# Patient Record
Sex: Female | Born: 1945 | Race: White | Hispanic: No | Marital: Married
Health system: Midwestern US, Academic
[De-identification: ages and names within clinical notes are randomized; demographics above are authoritative.]

---

## 2017-04-05 NOTE — Unmapped (Signed)
Called and spoke with the patient and obtained a pre-clinic phone screen and reviewed the details of where to come for her appointment tomorrow. Patient verbalized understanding.

## 2017-04-06 ENCOUNTER — Ambulatory Visit: Admit: 2017-04-06 | Discharge: 2017-04-06 | Payer: Medicare (Managed Care)

## 2017-04-06 DIAGNOSIS — C541 Malignant neoplasm of endometrium: Secondary | ICD-10-CM

## 2017-04-06 NOTE — Unmapped (Signed)
History of Present Illness  HPI  Amy Knight is a 70 y.o. female with   Chief Complaint   Patient presents with   ??? New Patient Visit/ Consultation     Ref Dr Renshaw Endometrial Adenocarcinoma     GYNECOLOGY ONCOLOGY VISIT     History of Present Illness  Amy Knight is a 70 year old woman referred by Dr. Amy Renshaw for a new diagnosis of grade 1 endometrial adenocarcinoma.    Amy. Wetsel presented for an annual visit, and had an abnormal pap that showed ASC-H.  She notes that she did have an episode of vaginal bleeding after lifting a heavy object. She underwent a TVUS on 02/21/17 that revealed the following: The uterus is anteverted and normal in size measuring 7.4 x 4.8 x 3.9 cm. No focal uterine mass is present. The endometrium is thickened at 1.21 cm which is abnormal for a postmenopausal woman. Neither the right nor left ovary could be visualized. There is no free fluid. She underwent an office EMB on 02/24/17 that has benign endocervical cells but no endometrium.  She had a negative colposcopy with biopsies and ECC (benign X2) and endometrial biopsy on 03/24/17, with EMB pathology revealing a grade 1 endometrial adenocarcinoma.  After discussion regarding these findings, she was referred for evaluation and management.    Amy Knight is doing well. She is very nervous.   She denies any abdominal/pelvic pain.   She denies any bloating or fullness.  She reports normal bowel/bladder habits.  She denies any GU concerns. Denies any dysuria, frequency, hematuria, flank pain or fevers.  She denies any vaginal bleeding or discharge.   She denies neuropathy.   She reports no leg swelling.   Her appetite is good, and reports no nausea or vomiting.     Patient Active Problem List    Diagnosis   ??? Endometrial cancer determined by uterine biopsy (CMS Dx)     Overview Note:     (last update: 04/06/2017)     Endometrial cancer, FIGO, grade 1  History of left breast cancer in 2008: treated with lumpectomy and  radiation per Dr. Cobb in [place]    RMD: Dr. Amy Renshaw    2018:    Pap: ASC-H    02/21/2017:   TVUS: The uterus is anteverted and normal in size measuring 7.4 x 4.8 x 3.9 cm. No focal uterine mass is present. The endometrium is thickened at 1.21 cm which is abnormal for a postmenopausal woman. Neither the right nor left ovary could be visualized. There is no free fluid.    02/24/2017:   EMB: SMALL FRAGMENTS OF BENIGN ENDOCERVICAL GLANDULAR EPITHELIUM AND SQUAMOUs??EPITHELIUM. NO ENDOMETRIAL TISSUE IS PRESENT FOR EVALUATION.    03/24/2017:   Colposcopy, EMB, D & C     Cervical bx, 9 o'clock: negative for dysplasia. ECC: Benign endocervical glandular epithelum     Office hysteroscopy: OSH endometrioid grade 1 EC     Disposition: R TLH BSO SLND   Current disease status:   Genetics:  Survivorship plan: pending completion of treatment            Past Medical History  She  has a past medical history of Endometrial cancer determined by uterine biopsy (CMS Dx) (03/2017); History of left breast cancer (2008); and Hypercholesteremia.  Past Medical History:   Diagnosis Date   ??? Endometrial cancer determined by uterine biopsy (CMS Dx) 03/2017   ??? History of left breast cancer 2008      Treated with lumpectomy and radiation only per Dr. Cobb in [place]   ??? Hypercholesteremia        Past Surgical History  Past Surgical History:   Procedure Laterality Date   ??? APPENDECTOMY  1983    Abdominal incision   ??? BREAST LUMPECTOMY  2008    Left breast   ??? CERVICAL BIOPSY  03/2017   ??? DILATION AND CURETTAGE OF UTERUS  03/2017   ??? ENDOMETRIAL BIOPSY  03/2017   ??? TUBAL LIGATION  1978       Family History  Family History   Problem Relation Age of Onset   ??? Pancreatic Cancer Mother      Died at age 85   ??? Prostate Cancer Brother      Family cancer history for ovarian, uterine and colon cancer is negative other than above.     Social History  Social History     Social History   ??? Marital status: Married     Spouse name: N/A   ??? Number of  children: N/A   ??? 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 Smoking status: Never Smoker   ??? Smokeless tobacco: Never Used   ??? Alcohol use No   ??? Drug use: No   ??? Sexual activity: Not Asked     Other Topics Concern   ??? Caffeine Use Yes   ??? Exercise No   ??? Seat Belt Yes     Social History Narrative   ??? None       Past OB/GYN History  G2P1  She reports menarche at age 13 and menopause at age 55.  She denies a history of STIs.  She denies a history of abnormal cervical cytology except for recent and reports her last cytologic examination in 2018 was ASC-H. She used OCPs for 7 years in her 20's and has not taken HRT.     Health maintenance:  Mammogram: Date 12/2016 Results normal  Colonoscopy: None but has had negative fecal occult.    Allergies  She is allergic to peanut.    BMI  Body mass index is 26.03 kg/m??.      The following portions of the patient's history were reviewed and updated as appropriate: allergies, current medications, past family history, past medical history, past social history, past surgical history and problem list.    Review of Systems   Constitutional: Negative for activity change, appetite change, chills, fatigue and fever.   HENT: Negative for mouth sores and sore throat.    Eyes: Negative for photophobia and visual disturbance.   Respiratory: Negative for cough, chest tightness and shortness of breath.    Cardiovascular: Negative for chest pain and leg swelling.   Gastrointestinal: Negative for abdominal distention, abdominal pain, bloating, constipation, diarrhea, nausea and vomiting.   Genitourinary: Positive for vaginal bleeding. Negative for difficulty urinating, frequency, pelvic pain, vaginal discharge and vaginal pain.   Musculoskeletal: Negative for arthralgias and back pain.   Skin: Negative for color change and pallor.   Neurological: Negative for dizziness, weakness and numbness.   Hematological: Negative for adenopathy.    Psychiatric/Behavioral: Negative for agitation, confusion and depression. The patient is nervous/anxious.        Vitals  Blood pressure 119/61, pulse 81, height 5' 4.5 (1.638 m), weight 154 lb (69.9 kg), SpO2 98 %.    Physical Exam   Constitutional: She is   oriented to person, place, and time. She appears well-developed and well-nourished.   HENT:   Head: Normocephalic and atraumatic.   Eyes: Conjunctivae are normal.   Neck: Normal range of motion.   Cardiovascular: Normal rate, regular rhythm and normal heart sounds.    Pulmonary/Chest: Effort normal and breath sounds normal.   Abdominal: Soft. She exhibits no distension and no mass. There is no tenderness. There is no rebound and no guarding.   No masses or tenderness.  No hernia palpable.   No hepatosplenomegaly.      Genitourinary:   Genitourinary Comments: Normal external genitalia/vulva.  Normal urethral meatus, urethra, bladder, anus and perineum.  Normal vagina and cervix.  Normal uterus and adnexa.  Normal rectum.  There is no nodularity in the posterior culdesac or rectovaginal septum.   Musculoskeletal: Normal range of motion.   Neurological: She is alert and oriented to person, place, and time.   Skin: Skin is warm and dry.   Psychiatric: She has a normal mood and affect. Her behavior is normal. Judgment and thought content normal.      Neurologic Exam     Mental Status   Oriented to person, place, and time.       Review of Lab Results  No results found for: WBC, HGB, HCT, PLT, CREATININE, BUN, PROT, AST, ALT, BILITOT, CALCIUM, MG, LDH      Imaging   No results found.    Investigations Reviewed:     2018:    Pap: ASC-H    03/24/2017:   Colposcopy, emb     Cervical bx, 9 o'clock: negative for dysplasia. ECC: Benign endocervical glandular epithelum     Office hysteroscopy: OSH endometrioid grade 1 EC     Cancer Staging:  Cancer Staging  No matching staging information was found for the patient.       Assessment & Plan  Amy Knight has recently been  diagnosed with grade 1 endometrial cancer. I have counseled her that surgery is the cornerstone of management and she is a candidate for the minimally invasive approach. This would consist of a robotic hysterectomy, bilateral salpingo-oophorectomy, and sentinel (and possible completion for non-mapping) lymph node dissection. The patient was also counseled extensively on the nature of endometrial cancer and the fact that a majority are found in the early stages. However, if more advanced disease is encountered, she may require chemotherapy, radiation or a combination of the two.     We also reviewed the risks of the procedure which include but are not limited to bleeding, infection, wound breakdown, blood clots, injury to surrounding organs including bowel, vessels, urinary tract and nerves requiring additional intervention, and the need for a laparotomy if the surgery cannot be performed safely via the minimally invasive approach. We also discussed the anticipated hospital stay and recovery time.    After all questions were answered she did sign the informed consent and will be scheduled for surgery in the near future. She will have a level 1 anesthesia evaluation prior to surgery. We will see her back postoperatively.    Surgery CheckList:  x__E signed consent  _x_Hyst from signed and scanned  _x_CPC level call  _x_Labs                               Brittany Gumbert, CNP  Division of Gynecologic Oncology  [phone]    I saw and personally examined the patient today with   my CNP B Gumbert. I discussed the findings and therapeutic plan with the patient. I repeated, reviewed and agree with the history of present illness, past medical histories, family history, social history, medication list, and allergies as listed. The review of systems is as noted above. My physical exam confirms the findings listed above. Review of labs, pathology reports, radiograph reports, and medical records confirm the findings noted above.  I agree with the assessment and plan as noted above. I have edited the note where appropriate.     I have personally performed a face to face diagnostic evaluation on this patient, seen initially by the CNP. I have personally developed the care plan as outlined in the Assessment and Plan.      Complexity: high     I spent a total of 40 minutes face-to-face of which >50% was spent in counseling and/or coordination of care, documentation and counseling.with patient and/or family.      Topics discussed include endometrail  Cancer prognosis and treatment options and management options.      Mayukha Symmonds C Eiliyah Reh, MD  Gynecology Oncology  [phone]      Medical Decision Making:  The following items were considered in medical decision making:  Review / order clinical lab tests  Review / order radiology tests  Review / order other diagnostic tests/interventions  Reviewed outside records

## 2017-04-06 NOTE — Unmapped (Signed)
History of Present Illness  HPI  Amy Knight is a 71 y.o. female with   Chief Complaint   Patient presents with   ??? New Patient Visit/ Consultation     Ref Dr Bonnee Quin Endometrial Adenocarcinoma     GYNECOLOGY ONCOLOGY VISIT     History of Present Illness  Amy Knight is a 71 year old woman referred by Dr. Winferd Humphrey for a new diagnosis of grade 1 endometrial adenocarcinoma.    Amy. Knight presented for an annual visit, and had an abnormal pap that showed ASC-H.  She notes that she did have an episode of vaginal bleeding after lifting a heavy object. She underwent a TVUS on 02/21/17 that revealed the following: The uterus is anteverted and normal in size measuring 7.4 x 4.8 x 3.9 cm. No focal uterine mass is present. The endometrium is thickened at 1.21 cm which is abnormal for a postmenopausal woman. Neither the right nor left ovary could be visualized. There is no free fluid. She underwent an office EMB on 02/24/17 that has benign endocervical cells but no endometrium.  She had a negative colposcopy with biopsies and ECC (benign X2) and endometrial biopsy on 03/24/17, with EMB pathology revealing a grade 1 endometrial adenocarcinoma.  After discussion regarding these findings, she was referred for evaluation and management.    Amy Knight is doing well. She is very nervous.   She denies any abdominal/pelvic pain.   She denies any bloating or fullness.  She reports normal bowel/bladder habits.  She denies any GU concerns. Denies any dysuria, frequency, hematuria, flank pain or fevers.  She denies any vaginal bleeding or discharge.   She denies neuropathy.   She reports no leg swelling.   Her appetite is good, and reports no nausea or vomiting.     Patient Active Problem List    Diagnosis   ??? Endometrial cancer determined by uterine biopsy (CMS Dx)     Overview Note:     (last update: 04/06/2017)     Endometrial cancer, FIGO, grade 1  History of left breast cancer in 2008: treated with lumpectomy and  radiation per Dr. Allison Quarry in [place]    RMD: Dr. Winferd Humphrey    2018:    Pap: ASC-H    02/21/2017:   TVUS: The uterus is anteverted and normal in size measuring 7.4 x 4.8 x 3.9 cm. No focal uterine mass is present. The endometrium is thickened at 1.21 cm which is abnormal for a postmenopausal woman. Neither the right nor left ovary could be visualized. There is no free fluid.    02/24/2017:   EMB: SMALL FRAGMENTS OF BENIGN ENDOCERVICAL GLANDULAR EPITHELIUM AND SQUAMOUs??EPITHELIUM. NO ENDOMETRIAL TISSUE IS PRESENT FOR EVALUATION.    03/24/2017:   Colposcopy, EMB, D & C     Cervical bx, 9 o'clock: negative for dysplasia. ECC: Benign endocervical glandular epithelum     Office hysteroscopy: OSH endometrioid grade 1 EC     Disposition: R TLH BSO SLND   Current disease status:   Genetics:  Survivorship plan: pending completion of treatment            Past Medical History  She  has a past medical history of Endometrial cancer determined by uterine biopsy (CMS Dx) (03/2017); History of left breast cancer (2008); and Hypercholesteremia.  Past Medical History:   Diagnosis Date   ??? Endometrial cancer determined by uterine biopsy (CMS Dx) 03/2017   ??? History of left breast cancer 2008  Treated with lumpectomy and radiation only per Dr. Allison Quarry in [place]   ??? Hypercholesteremia        Past Surgical History  Past Surgical History:   Procedure Laterality Date   ??? APPENDECTOMY  1983    Abdominal incision   ??? BREAST LUMPECTOMY  2008    Left breast   ??? CERVICAL BIOPSY  03/2017   ??? DILATION AND CURETTAGE OF UTERUS  03/2017   ??? ENDOMETRIAL BIOPSY  03/2017   ??? TUBAL LIGATION  1978       Family History  Family History   Problem Relation Age of Onset   ??? Pancreatic Cancer Mother      Died at age 73   ??? Prostate Cancer Brother      Family cancer history for ovarian, uterine and colon cancer is negative other than above.     Social History  Social History     Social History   ??? Marital status: Married     Spouse name: N/A   ??? Number of  children: N/A   ??? 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 Smoking status: Never Smoker   ??? Smokeless tobacco: Never Used   ??? Alcohol use No   ??? Drug use: No   ??? Sexual activity: Not Asked     Other Topics Concern   ??? Caffeine Use Yes   ??? Exercise No   ??? Seat Belt Yes     Social History Narrative   ??? None       Past OB/GYN History  G2P1  She reports menarche at age 46 and menopause at age 57.  She denies a history of STIs.  She denies a history of abnormal cervical cytology except for recent and reports her last cytologic examination in 2018 was ASC-H. She used OCPs for 7 years in her 58's and has not taken HRT.     Health maintenance:  Mammogram: Date 12/2016 Results normal  Colonoscopy: None but has had negative fecal occult.    Allergies  She is allergic to peanut.    BMI  Body mass index is 26.03 kg/m??.      The following portions of the patient's history were reviewed and updated as appropriate: allergies, current medications, past family history, past medical history, past social history, past surgical history and problem list.    Review of Systems   Constitutional: Negative for activity change, appetite change, chills, fatigue and fever.   HENT: Negative for mouth sores and sore throat.    Eyes: Negative for photophobia and visual disturbance.   Respiratory: Negative for cough, chest tightness and shortness of breath.    Cardiovascular: Negative for chest pain and leg swelling.   Gastrointestinal: Negative for abdominal distention, abdominal pain, bloating, constipation, diarrhea, nausea and vomiting.   Genitourinary: Positive for vaginal bleeding. Negative for difficulty urinating, frequency, pelvic pain, vaginal discharge and vaginal pain.   Musculoskeletal: Negative for arthralgias and back pain.   Skin: Negative for color change and pallor.   Neurological: Negative for dizziness, weakness and numbness.   Hematological: Negative for adenopathy.      Psychiatric/Behavioral: Negative for agitation, confusion and depression. The patient is nervous/anxious.        Vitals  Blood pressure 119/61, pulse 81, height 5' 4.5 (1.638 m), weight 154 lb (69.9 kg), SpO2 98 %.    Physical Exam   Constitutional:  She is oriented to person, place, and time. She appears well-developed and well-nourished.   HENT:   Head: Normocephalic and atraumatic.   Eyes: Conjunctivae are normal.   Neck: Normal range of motion.   Cardiovascular: Normal rate, regular rhythm and normal heart sounds.    Pulmonary/Chest: Effort normal and breath sounds normal.   Abdominal: Soft. She exhibits no distension and no mass. There is no tenderness. There is no rebound and no guarding.   No masses or tenderness.  No hernia palpable.   No hepatosplenomegaly.      Genitourinary:   Genitourinary Comments: Normal external genitalia/vulva.  Normal urethral meatus, urethra, bladder, anus and perineum.  Normal vagina and cervix.  Normal uterus and adnexa.  Normal rectum.  There is no nodularity in the posterior culdesac or rectovaginal septum.   Musculoskeletal: Normal range of motion.   Neurological: She is alert and oriented to person, place, and time.   Skin: Skin is warm and dry.   Psychiatric: She has a normal mood and affect. Her behavior is normal. Judgment and thought content normal.      Neurologic Exam     Mental Status   Oriented to person, place, and time.       Review of Lab Results  No results found for: WBC, HGB, HCT, PLT, CREATININE, BUN, PROT, AST, ALT, BILITOT, CALCIUM, MG, LDH      Imaging   No results found.    Investigations Reviewed:     2018:    Pap: ASC-H    03/24/2017:   Colposcopy, emb     Cervical bx, 9 o'clock: negative for dysplasia. ECC: Benign endocervical glandular epithelum     Office hysteroscopy: OSH endometrioid grade 1 EC     Cancer Staging:  Cancer Staging  No matching staging information was found for the patient.       Assessment & Plan  Amy Knight has recently been  diagnosed with grade 1 endometrial cancer. I have counseled her that surgery is the cornerstone of management and she is a candidate for the minimally invasive approach. This would consist of a robotic hysterectomy, bilateral salpingo-oophorectomy, and sentinel (and possible completion for non-mapping) lymph node dissection. The patient was also counseled extensively on the nature of endometrial cancer and the fact that a majority are found in the early stages. However, if more advanced disease is encountered, she may require chemotherapy, radiation or a combination of the two.     We also reviewed the risks of the procedure which include but are not limited to bleeding, infection, wound breakdown, blood clots, injury to surrounding organs including bowel, vessels, urinary tract and nerves requiring additional intervention, and the need for a laparotomy if the surgery cannot be performed safely via the minimally invasive approach. We also discussed the anticipated hospital stay and recovery time.    After all questions were answered she did sign the informed consent and will be scheduled for surgery in the near future. She will have a level 1 anesthesia evaluation prior to surgery. We will see her back postoperatively.    Surgery CheckList:  x__E signed consent  _x_Hyst from signed and scanned  _x_CPC level call  _x_Labs                               Amy Caras, CNP  Division of Gynecologic Oncology  [phone]    I saw and personally examined the patient  today with my CNP B Gumbert. I discussed the findings and therapeutic plan with the patient. I repeated, reviewed and agree with the history of present illness, past medical histories, family history, social history, medication list, and allergies as listed. The review of systems is as noted above. My physical exam confirms the findings listed above. Review of labs, pathology reports, radiograph reports, and medical records confirm the findings noted above.  I agree with the assessment and plan as noted above. I have edited the note where appropriate.     I have personally performed a face to face diagnostic evaluation on this patient, seen initially by the CNP. I have personally developed the care plan as outlined in the Assessment and Plan.      Complexity: high     I spent a total of 40 minutes face-to-face of which >50% was spent in counseling and/or coordination of care, documentation and counseling.with patient and/or family.      Topics discussed include endometrail  Cancer prognosis and treatment options and management options.      Amy Font, MD  Gynecology Oncology  [phone]      Medical Decision Making:  The following items were considered in medical decision making:  Review / order clinical lab tests  Review / order radiology tests  Review / order other diagnostic tests/interventions  Reviewed outside records

## 2017-04-06 NOTE — Unmapped (Signed)
History of Present Illness  HPI  Amy Knight is a 71 y.o. female with   Chief Complaint   Patient presents with   ??? New Patient Visit/ Consultation     Ref Dr Bonnee Quin Endometrial Adenocarcinoma     GYNECOLOGY ONCOLOGY VISIT     History of Present Illness  Amy Knight is a 71 year old woman referred by Dr. Winferd Humphrey for a new diagnosis of grade 1 endometrial adenocarcinoma.    Amy. Amy Knight presented for an annual visit, and had an abnormal pap that showed ASC-H.  She notes that she did have an episode of vaginal bleeding after lifting a heavy object. She underwent a TVUS on 02/21/17 that revealed the following: The uterus is anteverted and normal in size measuring 7.4 x 4.8 x 3.9 cm. No focal uterine mass is present. The endometrium is thickened at 1.21 cm which is abnormal for a postmenopausal woman. Neither the right nor left ovary could be visualized. There is no free fluid. She underwent an office EMB on 02/24/17 that has benign endocervical cells but no endometrium.  She had a negative colposcopy with biopsies and ECC (benign X2) and endometrial biopsy on 03/24/17, with EMB pathology revealing a grade 1 endometrial adenocarcinoma.  After discussion regarding these findings, she was referred for evaluation and management.    Amy Knight is doing well. She is very nervous.   She denies any abdominal/pelvic pain.   She denies any bloating or fullness.  She reports normal bowel/bladder habits.  She denies any GU concerns. Denies any dysuria, frequency, hematuria, flank pain or fevers.  She denies any vaginal bleeding or discharge.   She denies neuropathy.   She reports no leg swelling.   Her appetite is good, and reports no nausea or vomiting.     Patient Active Problem List    Diagnosis   ??? Endometrial cancer determined by uterine biopsy (CMS Dx)     Overview Note:     (last update: 04/06/2017)     Endometrial cancer, FIGO, grade 1  History of left breast cancer in 2008: treated with lumpectomy and  radiation per Dr. Allison Quarry in [place]    RMD: Dr. Winferd Humphrey    2018:    Pap: ASC-H    02/21/2017:   TVUS: The uterus is anteverted and normal in size measuring 7.4 x 4.8 x 3.9 cm. No focal uterine mass is present. The endometrium is thickened at 1.21 cm which is abnormal for a postmenopausal woman. Neither the right nor left ovary could be visualized. There is no free fluid.    02/24/2017:   EMB: SMALL FRAGMENTS OF BENIGN ENDOCERVICAL GLANDULAR EPITHELIUM AND SQUAMOUs??EPITHELIUM. NO ENDOMETRIAL TISSUE IS PRESENT FOR EVALUATION.    03/24/2017:   Colposcopy, EMB, D & C     Cervical bx, 9 o'clock: negative for dysplasia. ECC: Benign endocervical glandular epithelum     Office hysteroscopy: OSH endometrioid grade 1 EC     Disposition: R TLH BSO SLND   Current disease status:   Genetics:  Survivorship plan: pending completion of treatment            Past Medical History  She  has a past medical history of Endometrial cancer determined by uterine biopsy (CMS Dx) (03/2017); History of left breast cancer (2008); and Hypercholesteremia.  Past Medical History:   Diagnosis Date   ??? Endometrial cancer determined by uterine biopsy (CMS Dx) 03/2017   ??? History of left breast cancer 2008  Treated with lumpectomy and radiation only per Dr. Allison Quarry in [place]   ??? Hypercholesteremia        Past Surgical History  Past Surgical History:   Procedure Laterality Date   ??? APPENDECTOMY  1983    Abdominal incision   ??? BREAST LUMPECTOMY  2008    Left breast   ??? CERVICAL BIOPSY  03/2017   ??? DILATION AND CURETTAGE OF UTERUS  03/2017   ??? ENDOMETRIAL BIOPSY  03/2017   ??? TUBAL LIGATION  1978       Family History  Family History   Problem Relation Age of Onset   ??? Pancreatic Cancer Mother      Died at age 87   ??? Prostate Cancer Brother      Family cancer history for ovarian, uterine and colon cancer is negative other than above.     Social History  Social History     Social History   ??? Marital status: Married     Spouse name: N/A   ??? Number of  children: N/A   ??? 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 Smoking status: Never Smoker   ??? Smokeless tobacco: Never Used   ??? Alcohol use No   ??? Drug use: No   ??? Sexual activity: Not Asked     Other Topics Concern   ??? Caffeine Use Yes   ??? Exercise No   ??? Seat Belt Yes     Social History Narrative   ??? None       Past OB/GYN History  G2P1  She reports menarche at age 58 and menopause at age 73.  She denies a history of STIs.  She denies a history of abnormal cervical cytology except for recent and reports her last cytologic examination in 2018 was ASC-H. She used OCPs for 7 years in her 28's and has not taken HRT.     Health maintenance:  Mammogram: Date 12/2016 Results normal  Colonoscopy: None but has had negative fecal occult.    Allergies  She is allergic to peanut.    BMI  Body mass index is 26.03 kg/m??.      The following portions of the patient's history were reviewed and updated as appropriate: allergies, current medications, past family history, past medical history, past social history, past surgical history and problem list.    Review of Systems   Constitutional: Negative for activity change, appetite change, chills, fatigue and fever.   HENT: Negative for mouth sores and sore throat.    Eyes: Negative for photophobia and visual disturbance.   Respiratory: Negative for cough, chest tightness and shortness of breath.    Cardiovascular: Negative for chest pain and leg swelling.   Gastrointestinal: Negative for abdominal distention, abdominal pain, bloating, constipation, diarrhea, nausea and vomiting.   Genitourinary: Positive for vaginal bleeding. Negative for difficulty urinating, frequency, pelvic pain, vaginal discharge and vaginal pain.   Musculoskeletal: Negative for arthralgias and back pain.   Skin: Negative for color change and pallor.   Neurological: Negative for dizziness, weakness and numbness.   Hematological: Negative for adenopathy.    Psychiatric/Behavioral: Negative for agitation, confusion and depression. The patient is nervous/anxious.        Vitals  Blood pressure 119/61, pulse 81, height 5' 4.5 (1.638 m), weight 154 lb (69.9 kg), SpO2 98 %.    Physical Exam   Constitutional: She is  oriented to person, place, and time. She appears well-developed and well-nourished.   HENT:   Head: Normocephalic and atraumatic.   Eyes: Conjunctivae are normal.   Neck: Normal range of motion.   Cardiovascular: Normal rate, regular rhythm and normal heart sounds.    Pulmonary/Chest: Effort normal and breath sounds normal.   Abdominal: Soft. She exhibits no distension and no mass. There is no tenderness. There is no rebound and no guarding.   No masses or tenderness.  No hernia palpable.   No hepatosplenomegaly.      Genitourinary:   Genitourinary Comments: Normal external genitalia/vulva.  Normal urethral meatus, urethra, bladder, anus and perineum.  Normal vagina and cervix.  Normal uterus and adnexa.  Normal rectum.  There is no nodularity in the posterior culdesac or rectovaginal septum.   Musculoskeletal: Normal range of motion.   Neurological: She is alert and oriented to person, place, and time.   Skin: Skin is warm and dry.   Psychiatric: She has a normal mood and affect. Her behavior is normal. Judgment and thought content normal.      Neurologic Exam     Mental Status   Oriented to person, place, and time.       Review of Lab Results  No results found for: WBC, HGB, HCT, PLT, CREATININE, BUN, PROT, AST, ALT, BILITOT, CALCIUM, MG, LDH      Imaging   No results found.    Investigations Reviewed:     2018:    Pap: ASC-H    03/24/2017:   Colposcopy, emb     Cervical bx, 9 o'clock: negative for dysplasia. ECC: Benign endocervical glandular epithelum     Office hysteroscopy: OSH endometrioid grade 1 EC     Cancer Staging:  Cancer Staging  No matching staging information was found for the patient.       Assessment & Plan  Amy Knight has recently been  diagnosed with grade 1 endometrial cancer. I have counseled her that surgery is the cornerstone of management and she is a candidate for the minimally invasive approach. This would consist of a robotic hysterectomy, bilateral salpingo-oophorectomy, and sentinel (and possible completion for non-mapping) lymph node dissection. The patient was also counseled extensively on the nature of endometrial cancer and the fact that a majority are found in the early stages. However, if more advanced disease is encountered, she may require chemotherapy, radiation or a combination of the two.     We also reviewed the risks of the procedure which include but are not limited to bleeding, infection, wound breakdown, blood clots, injury to surrounding organs including bowel, vessels, urinary tract and nerves requiring additional intervention, and the need for a laparotomy if the surgery cannot be performed safely via the minimally invasive approach. We also discussed the anticipated hospital stay and recovery time.    After all questions were answered she did sign the informed consent and will be scheduled for surgery in the near future. She will have a level 1 anesthesia evaluation prior to surgery. We will see her back postoperatively.    Surgery CheckList:  x__E signed consent  _x_Hyst from signed and scanned  _x_CPC level call  _x_Labs                               Mabeline Caras, CNP  Division of Gynecologic Oncology  [phone]    I saw and personally examined the patient today with  my CNP B Gumbert. I discussed the findings and therapeutic plan with the patient. I repeated, reviewed and agree with the history of present illness, past medical histories, family history, social history, medication list, and allergies as listed. The review of systems is as noted above. My physical exam confirms the findings listed above. Review of labs, pathology reports, radiograph reports, and medical records confirm the findings noted above.  I agree with the assessment and plan as noted above. I have edited the note where appropriate.     I have personally performed a face to face diagnostic evaluation on this patient, seen initially by the CNP. I have personally developed the care plan as outlined in the Assessment and Plan.      Complexity: high     I spent a total of 40 minutes face-to-face of which >50% was spent in counseling and/or coordination of care, documentation and counseling.with patient and/or family.      Topics discussed include endometrail  Cancer prognosis and treatment options and management options.      Maggie Font, MD  Gynecology Oncology  [phone]      Medical Decision Making:  The following items were considered in medical decision making:  Review / order clinical lab tests  Review / order radiology tests  Review / order other diagnostic tests/interventions  Reviewed outside records

## 2017-04-11 MED ORDER — bupivacaine (PF) (SENSORCAINE/MARCAINE) 0.5 % (5 mg/mL) Soln
0.5 | INTRAMUSCULAR | Status: AC
Start: 2017-04-11 — End: 2017-04-11

## 2017-04-11 MED FILL — SENSORCAINE-MPF 0.5 % (5 MG/ML) INJECTION SOLUTION: 0.5 0.5 % (5 mg/mL) | INTRAMUSCULAR | Qty: 30

## 2017-04-26 ENCOUNTER — Ambulatory Visit: Admit: 2017-04-26 | Discharge: 2017-06-19 | Payer: Medicare (Managed Care) | Attending: Adult Health

## 2017-04-26 DIAGNOSIS — C541 Malignant neoplasm of endometrium: Secondary | ICD-10-CM

## 2017-04-26 LAB — HEPATIC FUNCTION PANEL
ALT: 22 U/L (ref 7–52)
AST: 15 U/L (ref 13–39)
Albumin: 4.2 g/dL (ref 3.5–5.7)
Alkaline Phosphatase: 109 U/L (ref 36–125)
Bilirubin, Direct: 0.2 mg/dL (ref 0.0–0.4)
Bilirubin, Indirect: 0.4 mg/dL (ref 0.0–1.1)
Total Bilirubin: 0.6 mg/dL (ref 0.0–1.5)
Total Protein: 7.1 g/dL (ref 6.4–8.9)

## 2017-04-26 LAB — CBC
Hematocrit: 43 % (ref 35.0–45.0)
Hemoglobin: 14.6 g/dL (ref 11.7–15.5)
MCH: 30.8 pg (ref 27.0–33.0)
MCHC: 34 g/dL (ref 32.0–36.0)
MCV: 90.6 fL (ref 80.0–100.0)
MPV: 7.3 fL (ref 7.5–11.5)
Platelets: 253 10*3/uL (ref 140–400)
RBC: 4.75 10*6/uL (ref 3.80–5.10)
RDW: 13.6 % (ref 11.0–15.0)
WBC: 7.6 10*3/uL (ref 3.8–10.8)

## 2017-04-26 LAB — BASIC METABOLIC PANEL
Anion Gap: 10 mmol/L (ref 3–16)
BUN: 18 mg/dL (ref 7–25)
CO2: 25 mmol/L (ref 21–33)
Calcium: 9.8 mg/dL (ref 8.6–10.3)
Chloride: 103 mmol/L (ref 98–110)
Creatinine: 0.62 mg/dL (ref 0.60–1.30)
Glucose: 163 mg/dL (ref 70–100)
Osmolality, Calculated: 291 mOsm/kg (ref 278–305)
Potassium: 4 mmol/L (ref 3.5–5.3)
Sodium: 138 mmol/L (ref 133–146)
eGFR AA CKD-EPI: >90 See note.
eGFR NONAA CKD-EPI: >90 See note.

## 2017-04-26 LAB — T4, FREE: Free T4: 1.16 ng/dL (ref 0.61–1.76)

## 2017-04-26 LAB — THYROID FUNCTION CASCADE: TSH: 0.2 u[IU]/mL (ref 0.45–4.12)

## 2017-04-26 LAB — T3: T3, Total: 106.2 ng/dL (ref 60.0–220.0)

## 2017-04-26 LAB — ABO/RH: Rh Type: POSITIVE

## 2017-04-26 LAB — ANTIBODY SCREEN: Antibody Screen: NEGATIVE

## 2017-04-26 NOTE — Unmapped (Signed)
Pre-Procedure Instructions  We???re pleased that you have chosen [facility] for your upcoming procedure.  The staff serving you is professionally trained to provide the highest quality care.  We encourage you to ask questions and to let the staff know your special needs.  We want your visit to be as comfortable as possible.    Your surgery is scheduled on September 10.  Please arrive at 0925 AM and check in at the Registration Desk on your right as you enter the lobby of the main hospital.    STARTING ONE WEEK BEFORE SURGERY  WE WOULD LIKE YOU TO STOP ASPIRIN, NSAIDS (non-steroidal anti-inflammatories such as Ibuprofen, Advil and Naproxen), SUPPLEMENTS, FISH OIL, VITAMINS, AND HERBAL SUPPLEMENTS.  ACETAMINOPHEN (TYLENOL) IS OK TO TAKE BEFORE SURGERY.      INSTRUCTIONS FOR THE DAY OF SURGERY    ?? DO NOT EAT OR DRINK ANYTHING (including gum, mints, water, etc.) after midnight the night before your procedure.  You may brush your teeth and gargle on the morning of surgery, but do not swallow any water, except for a small sip, with the following medication: Synthroid    ??? Please make transportation arrangements and bring a responsible adult to accompany you home and remain with you for 24 hours.    ??? We recommend that you leave valuables (i.e. money, jewelry, credit cards) at home.  If you wear glasses or contacts, bring a case for safekeeping.    ??? Wear casual, loose fitting, and comfortable clothing.  A gown will be provided. (In an effort to keep your personal belongings safe it is recommended that you leave these belongings with your family or in the car until admitted to your room after surgery. If you do bring a bag for an overnight stay, please note that storage space is limited in the surgery area.)    ??? Bring a list of your medications and dose including herbal.  Do not bring any pills or medications to the hospital. (Exception: transplant patients.)    ??? Bring a photo ID and your insurance card so your  insurance company can be billed directly.    ??? Do not shave in the area of the surgery for 2 days prior to surgery.  If needed, a trained staff member will clip the area immediately before your surgery.    ??? If you have a cold or are sick prior to surgery, contact your surgeon before surgery.    ??? Please shower at home the evening before and the morning of surgery using an antibacterial soap, such as Dial or Safeguard.    ??? Please remove all makeup, nail polish, jewelry, body piercings, powder, lotions, and perfume/cologne before you arrive.  Starting on September 10, day before surgery, please start the following:    ?? Chlorhexidine (CHG)/Surgical Scrub: You have been provided hibiclens for preop shower.You will use this soap to wash your entire body (except for genital area) the night before and morning of your surgery .DO NOT APPLY THIS SOAP ABOVE YOUR NECK, or in your private area  ?? Only use the antibacterial lotion given for the 1 day prior to surgery.  Do not use this lotion the morning of surgery.    Antibacterial showering and good hand hygiene are essential to prevent surgical site infections and reduce the spread of MRSA.  Please take a shower the morning of surgery using an antibacterial soap.  Patient verbalized understanding of these instructions.    Make sure all of  your health care givers are checking your ID bracelet and verifying your name and date of birth.  You will actively be involved in verifying the type of surgery you are having and the correct site.  Your health care givers should be cleaning their hands with soap and water or antibacterial foam before taking care of you and if they do not it is ok to remind them to do so.      In an effort to reduce the risks of blood clots after your surgery you will have compression sleeves on your lower legs.  These sleeves help facilitate circulation and decrease the chances of developing any blood clots.     You will be given an incentive spirometer  after surgery to use every hour to help prevent pneumonia by having you take deep breaths in and out.  You will be given instructions about proper use after surgery.         Patient/Family provided education about surgical site infection prevention.    Contact information:    [facility] Pre-admission Testing,  Monday - Friday 8:00 am - 4:30 pm,   [phone].    If you need to reach someone outside of regular business hours regarding your surgery please call your surgeon or   [facility] at [phone].

## 2017-04-26 NOTE — Unmapped (Signed)
ANESTHESIOLOGY CONSULTATION AND PRE-OPERATIVE HISTORY AND PHYSICAL       Subjective:        [facility] NP / PA:   Armstead Peaks, CNP    Date of Surgery:  05/01/17  Surgeon:  Dr. Lenora Boys  Diagnosis:  endometrial cancer determined by uterine biopsy  Procedure:  Hysterectomy salpingo-oophorectomy lymph node dissection robotic assisted sentinel nodes    Patient ID: Amy Knight is a 71 y.o. female.    Patient is being seen today at the request of Dr. Lenora Boys to render an opinion on perioperative risk optimization and to coordinate medical care as necessary prior to the following procedure:  Hysterectomy salpingo-oophorectomy lymph node dissection robotic assisted sentinel nodes.    Chief Complaint   Patient presents with   ??? Pre-op Exam     endometrial cancer determined by uterine biopsy       History of Present Illness:  71 yo woman with PMH of breast CA, HLD with endometrial CA. She is to undergo hysterectomy salpingo-oophorectomy lymph node dissection robotic assisted sentinel nodes and is seen today in pre-op. Abnormal pap on routine screening. She underwent TVUS and EMB which showed grade 1 endometrial adenocarcinoma. She does admit to one episode of PmB after lifting a heavy object.  Denies vaginal bleeding since. She reports occasional abd /pelvic cramping on occasion, denies exacerbating factors. Denies N/V, fevers/chills, and weight loss.     Chronic Medical Conditions, Severity, Optimization:  See below    Duke Activity Scale:  8 - Moving heavy furniture; rapidly climbing stairs; carrying 20 pounds up stairs.      Medical History:     Past Medical History:   Diagnosis Date   ??? Endometrial cancer determined by uterine biopsy (CMS Dx) 03/2017   ??? History of left breast cancer 2008    Treated with lumpectomy and radiation only per Dr. Allison Quarry in [place]   ??? Hypercholesteremia    ??? Hypothyroidism        Surgical History:     Past Surgical History:   Procedure Laterality Date   ??? APPENDECTOMY  1983    Abdominal  incision   ??? BREAST LUMPECTOMY  2008    Left breast pin head malignancy 2008   ??? CERVICAL BIOPSY  03/2017   ??? DILATION AND CURETTAGE OF UTERUS  03/2017   ??? ENDOMETRIAL BIOPSY  03/2017   ??? TUBAL LIGATION  1978       Family History:     Family History   Problem Relation Age of Onset   ??? Pancreatic Cancer Mother      Died at age 93   ??? Heart disease Mother      congenital per family   ??? Prostate Cancer Brother        Social History:     Social History     Social History   ??? Marital status: Married     Spouse name: N/A   ??? Number of children: N/A   ??? 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 Smoking status: Never Smoker   ??? Smokeless tobacco: Never Used   ??? Alcohol use No   ??? Drug use: No   ??? Sexual activity: Not on file     Other Topics Concern   ??? Caffeine Use Yes   ??? Exercise No   ??? Seat Belt Yes     Social History  Narrative   ??? No narrative on file       Allergies:     Allergies   Allergen Reactions   ??? Peanut Hives       Medications:     Prior to Admission medications taking for visit date 04/26/17   Medication Sig Taking? Authorizing Provider   aspirin 81 MG chewable tablet Chew 81 mg by mouth daily. Yes Historical Provider, MD   levothyroxine (SYNTHROID, LEVOTHROID) 75 MCG tablet Take by mouth. Yes Historical Provider, MD   simvastatin (ZOCOR) 40 MG tablet Take by mouth. Yes Historical Provider, MD          Review of Systems   Constitutional: Positive for weight loss. Negative for activity change, appetite change, chills, fatigue, fever and weight gain.        5 pound weight loss with anxiety from surgery   HENT: Negative for congestion, dental problem, ear pain, hearing loss, mouth sores, postnasal drip, rhinorrhea, sinus pressure, sneezing, sore throat, tinnitus and trouble swallowing.    Eyes: Negative for pain and visual disturbance.   Respiratory: Negative for apnea, cough, choking, chest tightness, shortness of breath and wheezing.         Denies OSA  and orthopnea   Gastrointestinal: Negative for abdominal pain, bloating, blood in stool, constipation, diarrhea, heartburn, nausea and vomiting.   Genitourinary: Negative for decreased urine volume, difficulty urinating, dysuria, frequency, hematuria, nocturia and urgency.   Musculoskeletal: Negative for arthralgias, back pain, gait problem, neck pain and neck stiffness.   Skin: Negative for rash and wound.   Neurological: Negative for dizziness, tremors, seizures, syncope, weakness, light-headedness, numbness and headaches.   Hematological: Does not bruise/bleed easily.       Objective:   Blood pressure 144/81, pulse 84, temperature 98.1 ??F (36.7 ??C), resp. rate 20, height 5' 4 (1.626 m), weight 153 lb 1 oz (69.4 kg), SpO2 98 %.    Physical Exam   Vitals reviewed.  Constitutional: She is oriented to person, place, and time. Vital signs are normal. She appears well-developed and well-nourished. No distress.   Body mass index is 26.27 kg/m??.  Husband present   HENT:   Head: Normocephalic and atraumatic.   Right Ear: Hearing and external ear normal.   Left Ear: Hearing and external ear normal.   Nose: Nose normal.   Mouth/Throat: Uvula is midline, oropharynx is clear and moist and mucous membranes are normal.   No loose/missing teeth   Eyes: Conjunctivae, EOM and lids are normal. Pupils are equal, round, and reactive to light. Right conjunctiva is not injected. No scleral icterus.   Neck: Trachea normal and normal range of motion. Neck supple. Carotid bruit is not present. No tracheal deviation and normal range of motion present. No thyroid mass and no thyromegaly present.   Cardiovascular: Normal rate, regular rhythm, S1 normal, S2 normal, normal heart sounds and normal pulses.  Exam reveals no gallop and no friction rub.    No murmur heard.  Pulses:       Carotid pulses are 2+ on the right side, and 2+ on the left side.       Radial pulses are 2+ on the right side, and 2+ on the left side.        Dorsalis pedis  pulses are 2+ on the right side, and 2+ on the left side.   Pulmonary/Chest: Effort normal and breath sounds normal. No stridor. No apnea. No respiratory distress. She has no decreased breath sounds. She has no  wheezes. She has no rhonchi. She has no rales. She exhibits no tenderness.   Abdominal: Soft. Normal appearance and bowel sounds are normal. She exhibits no distension and no mass. There is no tenderness. There is no rebound and no guarding.   Musculoskeletal: Normal range of motion. She exhibits no edema or tenderness.   Strength equal BUE/BLE   Lymphadenopathy:     She has no cervical adenopathy.   Neurological: She is alert and oriented to person, place, and time. She has normal strength and normal reflexes. She displays no tremor. No cranial nerve deficit or sensory deficit. She exhibits normal muscle tone. She displays a negative Romberg sign. Coordination and gait normal.   Skin: Skin is warm, dry and intact. No petechiae, no purpura and no rash noted. She is not diaphoretic. No cyanosis or erythema. No pallor. Nails show no clubbing.   Psychiatric: She has a normal mood and affect. Her speech is normal and behavior is normal. Judgment and thought content normal. Cognition and memory are normal.       Airway:  Mallampati III (soft and hard palate and base of uvula visible), Thyromental distance 3 finger breadths, opening 3 finger breadths. Dentition intact; no chipped or loose teeth.  Good neck ROM.     Lab Review:     Lab Results   Component Value Date    WBC 7.6 04/26/2017    HGB 14.6 04/26/2017    HCT 43.0 04/26/2017    MCH 30.8 04/26/2017    PLT 253 04/26/2017    GLUCOSE 163 (H) 04/26/2017    CREATININE 0.62 04/26/2017    NA 138 04/26/2017    K 4.0 04/26/2017    CL 103 04/26/2017    CO2 25 04/26/2017    BILITOT 0.6 04/26/2017    PROT 7.1 04/26/2017    AST 15 04/26/2017    ALT 22 04/26/2017    ALKPHOS 109 04/26/2017         Study Results:   None    ASA Physical Status:   ASA Physical Status:  2        Assessment and Recommendations:   71 yo woman seen preoperatively to Hysterectomy salpingo-oophorectomy lymph node dissection robotic assisted sentinel nodes. Concurrent medical conditions include:    1. Cardiac risk/functional status: denies CAD/CHF. Denies h/o cardiac work-up. Denies CP, SOB and orthopnea. Duke activity score 8; yard work, lifting heavy bags of mulch without CP ro DOE. States she is very active in her day to day. Benign exam, pt endorses good functional status. RCRI=1 (surgery). No work-up indicated prior to OR.     2. HLD: on statin. Continue perioperatively.     3. Hypothyroidism: on levothyroxine. TSH unknown, sent today. Advised to take AM of OR.     4. (L) breast CA: s/p lumpectomy and radiation in 2008. No longer follows with oncology.     5. Endometrial CA: to undergo hysterectomy.     Today we obtained T&S, CBC, BMP, hepatic, and TSH w/reflex T4.  Pre-procedural instructions given, patient verbalized understanding.      I have conveyed my assessment and recommendations to the referring provider via shared EMR.    Armstead Peaks, CNP

## 2017-05-01 ENCOUNTER — Observation Stay
Admit: 2017-05-01 | Discharge: 2017-05-02 | Disposition: A | Discharge status: Home / Self Care | Payer: Medicare (Managed Care) | Source: Ambulatory Visit

## 2017-05-01 DIAGNOSIS — C541 Malignant neoplasm of endometrium: Secondary | ICD-10-CM

## 2017-05-01 MED ORDER — heparin (porcine) injection 5,000 Units
5000 | Freq: Three times a day (TID) | INTRAMUSCULAR | Status: AC
Start: 2017-05-01 — End: 2017-05-02
  Administered 2017-05-01 – 2017-05-02 (×2): 5000 [IU] via SUBCUTANEOUS

## 2017-05-01 MED ORDER — morphine 10 mg/mL injection
10 | INTRAVENOUS | Status: AC | PRN
Start: 2017-05-01 — End: 2017-05-01
  Administered 2017-05-01: 16:00:00 4 via INTRAVENOUS
  Administered 2017-05-01 (×3): 2 via INTRAVENOUS

## 2017-05-01 MED ORDER — naloxone (NARCAN) injection 0.04 mg
0.4 | INTRAMUSCULAR | Status: AC | PRN
Start: 2017-05-01 — End: 2017-05-01

## 2017-05-01 MED ORDER — fentaNYL (SUBLIMAZE) injection 50 mcg
50 | INTRAMUSCULAR | Status: AC | PRN
Start: 2017-05-01 — End: 2017-05-01

## 2017-05-01 MED ORDER — lidocaine (PF) 2% (20 mg/mL) Soln 20 mg
20 | Freq: Once | INTRAMUSCULAR | Status: AC | PRN
Start: 2017-05-01 — End: 2017-05-01

## 2017-05-01 MED ORDER — neostigmine methylsulfate (PROSTIGMIN) IV solution
1 | INTRAVENOUS | Status: AC | PRN
Start: 2017-05-01 — End: 2017-05-01
  Administered 2017-05-01: 19:00:00 3 via INTRAVENOUS

## 2017-05-01 MED ORDER — oxyCODONE (ROXICODONE) immediate release tablet 5 mg
5 | Freq: Once | ORAL | Status: AC | PRN
Start: 2017-05-01 — End: 2017-05-01

## 2017-05-01 MED ORDER — scopolamine (TRANSDERM-SCOP) (1 mg over 3 days) 1 patch
1 | Freq: Once | TRANSDERMAL | Status: AC
Start: 2017-05-01 — End: 2017-05-01

## 2017-05-01 MED ORDER — heparin (porcine) injection 5,000 Units
5000 | Freq: Once | INTRAMUSCULAR | Status: AC
Start: 2017-05-01 — End: 2017-05-01
  Administered 2017-05-01: 14:00:00 5000 [IU] via SUBCUTANEOUS

## 2017-05-01 MED ORDER — celecoxib (CELEBREX) capsule 400 mg
200 | ORAL | Status: AC | PRN
Start: 2017-05-01 — End: 2017-05-01
  Administered 2017-05-01: 14:00:00 400 mg via ORAL

## 2017-05-01 MED ORDER — lidocaine (PF) 20 mg/mL (2 %) Soln
20 | INTRAVENOUS | Status: AC | PRN
Start: 2017-05-01 — End: 2017-05-01
  Administered 2017-05-01: 16:00:00 50 via INTRAVENOUS

## 2017-05-01 MED ORDER — gabapentin (NEURONTIN) capsule 300 mg
300 | ORAL | Status: AC | PRN
Start: 2017-05-01 — End: 2017-05-01

## 2017-05-01 MED ORDER — propofol 10 mg/ml (DIPRIVAN) 10 mg/mL injection
10 | INTRAVENOUS | Status: AC
Start: 2017-05-01 — End: ?

## 2017-05-01 MED ORDER — morphine injection Crtg 2 mg
2 | INTRAVENOUS | Status: AC | PRN
Start: 2017-05-01 — End: 2017-05-01

## 2017-05-01 MED ORDER — glycopyrrolate (ROBINUL) injection
0.2 | INTRAMUSCULAR | Status: AC | PRN
Start: 2017-05-01 — End: 2017-05-01
  Administered 2017-05-01: 19:00:00 .4 via INTRAVENOUS

## 2017-05-01 MED ORDER — sodium chloride flush 10 mL
INTRAMUSCULAR | Status: AC
Start: 2017-05-01 — End: 2017-05-02
  Administered 2017-05-02 (×2): 10 mL via INTRAVENOUS

## 2017-05-01 MED ORDER — lactated Ringers infusion
INTRAVENOUS | Status: AC | PRN
Start: 2017-05-01 — End: 2017-05-01
  Administered 2017-05-01 (×2): via INTRAVENOUS

## 2017-05-01 MED ORDER — lactated Ringers infusion
Freq: Once | INTRAVENOUS | Status: AC
Start: 2017-05-01 — End: 2017-05-01
  Administered 2017-05-01: 14:00:00 50 mL/h via INTRAVENOUS

## 2017-05-01 MED ORDER — dexamethasone (DECADRON) injection
4 | INTRAMUSCULAR | Status: AC | PRN
Start: 2017-05-01 — End: 2017-05-01
  Administered 2017-05-01: 16:00:00 4 via INTRAVENOUS

## 2017-05-01 MED ORDER — propofol 10 mg/ml (DIPRIVAN) injection
10 | INTRAVENOUS | Status: AC | PRN
Start: 2017-05-01 — End: 2017-05-01
  Administered 2017-05-01: 16:00:00 30 via INTRAVENOUS
  Administered 2017-05-01: 16:00:00 150 via INTRAVENOUS

## 2017-05-01 MED ORDER — sodium chloride 0.9%
INTRAMUSCULAR | Status: AC
Start: 2017-05-01 — End: ?

## 2017-05-01 MED ORDER — fentaNYL (SUBLIMAZE) 50 mcg/mL injection
50 | INTRAMUSCULAR | Status: AC
Start: 2017-05-01 — End: ?

## 2017-05-01 MED ORDER — rocuronium (ZEMURON) 10 mg/mL injection
10 | INTRAVENOUS | Status: AC
Start: 2017-05-01 — End: ?

## 2017-05-01 MED ORDER — acetaminophen (TYLENOL) tablet 975 mg
325 | ORAL | Status: AC | PRN
Start: 2017-05-01 — End: 2017-05-01
  Administered 2017-05-01: 14:00:00 975 mg via ORAL

## 2017-05-01 MED ORDER — indocyanine green (IC-GREEN) injection
25 | INTRAMUSCULAR | Status: AC | PRN
Start: 2017-05-01 — End: 2017-05-01
  Administered 2017-05-01: 18:00:00 25 via INTRAMUSCULAR

## 2017-05-01 MED ORDER — famotidine (PF) (PEPCID) injection 20 mg
20 | Freq: Two times a day (BID) | INTRAVENOUS | Status: AC
Start: 2017-05-01 — End: 2017-05-02
  Administered 2017-05-02 (×2): 20 mg via INTRAVENOUS

## 2017-05-01 MED ORDER — ondansetron (ZOFRAN) injection 8 mg
4 | Freq: Three times a day (TID) | INTRAMUSCULAR | Status: AC | PRN
Start: 2017-05-01 — End: 2017-05-02
  Administered 2017-05-02 (×2): 8 mg via INTRAVENOUS

## 2017-05-01 MED ORDER — ondansetron (ZOFRAN) tablet 8 mg
8 | Freq: Three times a day (TID) | ORAL | Status: AC | PRN
Start: 2017-05-01 — End: 2017-05-02

## 2017-05-01 MED ORDER — dexamethasone (DECADRON) 4 mg/mL injection
4 | INTRAMUSCULAR | Status: AC
Start: 2017-05-01 — End: ?

## 2017-05-01 MED ORDER — simethicone (MYLICON) chewable tablet 80 mg
80 | Freq: Four times a day (QID) | ORAL | Status: AC | PRN
Start: 2017-05-01 — End: 2017-05-02

## 2017-05-01 MED ORDER — bupivacaine (PF) (MARCAINE) 0.25 % (2.5 mg/mL) injection Soln
0.25 | INTRAMUSCULAR | Status: AC
Start: 2017-05-01 — End: 2017-05-01

## 2017-05-01 MED ORDER — ketorolac (TORADOL) injection 15 mg
15 | Freq: Three times a day (TID) | INTRAMUSCULAR | Status: AC
Start: 2017-05-01 — End: 2017-05-02
  Administered 2017-05-02 (×2): 15 mg via INTRAVENOUS

## 2017-05-01 MED ORDER — senna (SENOKOT) tablet 1 tablet
8.6 | Freq: Every evening | ORAL | Status: AC
Start: 2017-05-01 — End: 2017-05-02

## 2017-05-01 MED ORDER — rocuronium (ZEMURON) injection
10 | INTRAVENOUS | Status: AC | PRN
Start: 2017-05-01 — End: 2017-05-01
  Administered 2017-05-01: 17:00:00 10 via INTRAVENOUS
  Administered 2017-05-01: 16:00:00 40 via INTRAVENOUS
  Administered 2017-05-01: 18:00:00 10 via INTRAVENOUS

## 2017-05-01 MED ORDER — fentaNYL (SUBLIMAZE) injection
50 | INTRAMUSCULAR | Status: AC | PRN
Start: 2017-05-01 — End: 2017-05-01
  Administered 2017-05-01 (×2): 50 via INTRAVENOUS

## 2017-05-01 MED ORDER — ceFAZolin (ANCEF) 2 gram in NSS 100 mL IVPB
2 | INTRAVENOUS | Status: AC | PRN
Start: 2017-05-01 — End: 2017-05-01
  Administered 2017-05-01: 16:00:00 via INTRAVENOUS

## 2017-05-01 MED ORDER — ondansetron (ZOFRAN) injection 4 mg
4 | Freq: Three times a day (TID) | INTRAMUSCULAR | Status: AC | PRN
Start: 2017-05-01 — End: 2017-05-01

## 2017-05-01 MED ORDER — morPHINE injection 4 mg
4 | INTRAVENOUS | Status: AC | PRN
Start: 2017-05-01 — End: 2017-05-01

## 2017-05-01 MED ORDER — morPHINE injection 6 mg
4 | INTRAVENOUS | Status: AC | PRN
Start: 2017-05-01 — End: 2017-05-01

## 2017-05-01 MED ORDER — neostigmine methylsulfate (PROSTIGMIN) 1 mg/mL IV solution
1 | INTRAVENOUS | Status: AC
Start: 2017-05-01 — End: ?

## 2017-05-01 MED ORDER — morphine 10 mg/mL injection
10 | INTRAVENOUS | Status: AC
Start: 2017-05-01 — End: ?

## 2017-05-01 MED ORDER — sodium chloride, irrigation 0.9 % irrigation
0.9 | Status: AC | PRN
Start: 2017-05-01 — End: 2017-05-01
  Administered 2017-05-01: 17:00:00 3000

## 2017-05-01 MED ORDER — oxyCODONE-acetaminophen (PERCOCET) 5-325 mg per tablet 1 tablet
5-325 | ORAL | Status: AC | PRN
Start: 2017-05-01 — End: 2017-05-02

## 2017-05-01 MED ORDER — sodium chloride 0.9 % infusion
INTRAVENOUS | Status: AC
Start: 2017-05-01 — End: 2017-05-02
  Administered 2017-05-01 – 2017-05-02 (×2): 125 mL/h via INTRAVENOUS

## 2017-05-01 MED ORDER — oxyCODONE (ROXICODONE) immediate release tablet 10 mg
5 | Freq: Once | ORAL | Status: AC | PRN
Start: 2017-05-01 — End: 2017-05-01

## 2017-05-01 MED ORDER — acetaminophen (TYLENOL) tablet 975 mg
325 | ORAL | Status: AC | PRN
Start: 2017-05-01 — End: 2017-05-01

## 2017-05-01 MED ORDER — docusate sodium (COLACE) capsule 100 mg
100 | Freq: Two times a day (BID) | ORAL | Status: AC
Start: 2017-05-01 — End: 2017-05-02

## 2017-05-01 MED ORDER — sugammadex (BRIDION) 100 mg/mL IV injection
100 | INTRAVENOUS | Status: AC
Start: 2017-05-01 — End: 2017-05-01

## 2017-05-01 MED ORDER — ondansetron (ZOFRAN) injection
4 | INTRAMUSCULAR | Status: AC | PRN
Start: 2017-05-01 — End: 2017-05-01
  Administered 2017-05-01: 18:00:00 4 via INTRAVENOUS

## 2017-05-01 MED ORDER — gabapentin (NEURONTIN) capsule 600 mg
300 | ORAL | Status: AC | PRN
Start: 2017-05-01 — End: 2017-05-01
  Administered 2017-05-01: 14:00:00 600 mg via ORAL

## 2017-05-01 MED ORDER — lactated Ringers infusion
INTRAVENOUS | Status: AC
Start: 2017-05-01 — End: ?

## 2017-05-01 MED ORDER — sodium chloride 0.9 % infusion
INTRAVENOUS | Status: AC
Start: 2017-05-01 — End: 2017-05-01

## 2017-05-01 MED ORDER — ketorolac (TORADOL) injection 30 mg
30 | INTRAMUSCULAR | Status: AC | PRN
Start: 2017-05-01 — End: 2017-05-01

## 2017-05-01 MED ORDER — metroNIDAZOLE (FLAGYL) in sodium chloride, iso-osm IVPB 500 mg
500 | INTRAVENOUS | Status: AC | PRN
Start: 2017-05-01 — End: 2017-05-01
  Administered 2017-05-01: 16:00:00 500 mg via INTRAVENOUS

## 2017-05-01 MED ORDER — fentaNYL (SUBLIMAZE) injection 12.5 mcg
50 | INTRAMUSCULAR | Status: AC | PRN
Start: 2017-05-01 — End: 2017-05-01

## 2017-05-01 MED ORDER — levothyroxine (SYNTHROID, LEVOTHROID) tablet 75 mcg
75 | Freq: Every day | ORAL | Status: AC
Start: 2017-05-01 — End: 2017-05-02
  Administered 2017-05-02: 10:00:00 75 ug via ORAL

## 2017-05-01 MED ORDER — sterile water (PF) Soln
INTRAMUSCULAR | Status: AC
Start: 2017-05-01 — End: 2017-05-01

## 2017-05-01 MED ORDER — lidocaine (PF) 2% (20 mg/mL) 20 mg/mL (2 %) Soln
20 | INTRAMUSCULAR | Status: AC
Start: 2017-05-01 — End: ?

## 2017-05-01 MED ORDER — indocyanine green (IC-GREEN) 25 mg injection
25 | INTRAMUSCULAR | Status: AC
Start: 2017-05-01 — End: 2017-05-01

## 2017-05-01 MED ORDER — bupivacaine (PF)(SENSORCAINE) 0.25% injection
0.25 | INTRAMUSCULAR | Status: AC | PRN
Start: 2017-05-01 — End: 2017-05-01
  Administered 2017-05-01: 17:00:00 30 via SUBCUTANEOUS

## 2017-05-01 MED ORDER — ondansetron (ZOFRAN) 4 mg/2 mL injection
4 | INTRAMUSCULAR | Status: AC
Start: 2017-05-01 — End: ?

## 2017-05-01 MED ORDER — oxyCODONE-acetaminophen (PERCOCET) 5-325 mg per tablet 2 tablet
5-325 | ORAL | Status: AC | PRN
Start: 2017-05-01 — End: 2017-05-02

## 2017-05-01 MED ORDER — fentaNYL (SUBLIMAZE) injection 25 mcg
50 | INTRAMUSCULAR | Status: AC | PRN
Start: 2017-05-01 — End: 2017-05-01

## 2017-05-01 MED ORDER — proMETHazine (PHENERGAN) injection 6.25 mg
25 | Freq: Four times a day (QID) | INTRAMUSCULAR | Status: AC | PRN
Start: 2017-05-01 — End: 2017-05-01

## 2017-05-01 MED ORDER — fentaNYL (SUBLIMAZE) injection 6.5 mcg
50 | INTRAMUSCULAR | Status: AC | PRN
Start: 2017-05-01 — End: 2017-05-01

## 2017-05-01 MED FILL — ROCURONIUM 10 MG/ML INTRAVENOUS SOLUTION: 10 10 mg/mL | INTRAVENOUS | Qty: 1

## 2017-05-01 MED FILL — ONDANSETRON HCL (PF) 4 MG/2 ML INJECTION SOLUTION: 4 4 mg/2 mL | INTRAMUSCULAR | Qty: 4

## 2017-05-01 MED FILL — DEXAMETHASONE SODIUM PHOSPHATE 4 MG/ML INJECTION SOLUTION: 4 4 mg/mL | INTRAMUSCULAR | Qty: 5

## 2017-05-01 MED FILL — SODIUM CHLORIDE 0.9 % INTRAVENOUS SOLUTION: INTRAVENOUS | Qty: 2000

## 2017-05-01 MED FILL — METRONIDAZOLE 500 MG/100 ML IN SODIUM CHLOR(ISO) INTRAVENOUS PIGGYBACK: 500 500 mg/100 mL | INTRAVENOUS | Qty: 100

## 2017-05-01 MED FILL — SODIUM CHLORIDE 0.9 % INTRAVENOUS SOLUTION: 125.00 125.00 mL/hr | INTRAVENOUS | Qty: 1000

## 2017-05-01 MED FILL — NEOSTIGMINE METHYLSULFATE 1 MG/ML INTRAVENOUS SOLUTION: 1 1 mg/mL | INTRAVENOUS | Qty: 10

## 2017-05-01 MED FILL — LACTATED RINGERS INTRAVENOUS SOLUTION: 50.00 50.00 mL/hr | INTRAVENOUS | Qty: 1000

## 2017-05-01 MED FILL — PROPOFOL 10 MG/ML INTRAVENOUS EMULSION: 10 10 mg/mL | INTRAVENOUS | Qty: 20

## 2017-05-01 MED FILL — BUPIVACAINE (PF) 0.25 % (2.5 MG/ML) INJECTION SOLUTION: 0.25 0.25 % (2.5 mg/mL) | INTRAMUSCULAR | Qty: 30

## 2017-05-01 MED FILL — TYLENOL 325 MG TABLET: 325 325 mg | ORAL | Qty: 3

## 2017-05-01 MED FILL — FAMOTIDINE (PF) 20 MG/2 ML INTRAVENOUS SOLUTION: 20 20 mg/2 mL | INTRAVENOUS | Qty: 2

## 2017-05-01 MED FILL — SODIUM CHLORIDE 0.9 % INJECTION SOLUTION: INTRAMUSCULAR | Qty: 10

## 2017-05-01 MED FILL — GABAPENTIN 300 MG CAPSULE: 300 300 MG | ORAL | Qty: 2

## 2017-05-01 MED FILL — CEFAZOLIN (ANCEF) 2 GRAM IN NSS IVPB - COMPOUNDED (ADM): 2 2 gram/100 mL | INTRAVENOUS | Qty: 100

## 2017-05-01 MED FILL — FENTANYL (PF) 50 MCG/ML INJECTION SOLUTION: 50 50 mcg/mL | INTRAMUSCULAR | Qty: 2

## 2017-05-01 MED FILL — LIDOCAINE (PF) 20 MG/ML (2 %) INJECTION SOLUTION: 20 20 mg/mL (2 %) | INTRAMUSCULAR | Qty: 5

## 2017-05-01 MED FILL — SCOPOLAMINE 1 MG OVER 3 DAYS TRANSDERMAL PATCH: 1 1 mg over 3 days | TRANSDERMAL | Qty: 1

## 2017-05-01 MED FILL — BRIDION 100 MG/ML INTRAVENOUS SOLUTION: 100 100 mg/mL | INTRAVENOUS | Qty: 2

## 2017-05-01 MED FILL — ONDANSETRON HCL (PF) 4 MG/2 ML INJECTION SOLUTION: 4 4 mg/2 mL | INTRAMUSCULAR | Qty: 2

## 2017-05-01 MED FILL — MORPHINE 10 MG/ML INTRAVENOUS SOLUTION: 10 10 mg/mL | INTRAVENOUS | Qty: 1

## 2017-05-01 MED FILL — LACTATED RINGERS INTRAVENOUS SOLUTION: INTRAVENOUS | Qty: 1000

## 2017-05-01 MED FILL — INDOCYANINE GREEN 25 MG SOLUTION FOR INJECTION: 25 25 mg | INTRAMUSCULAR | Qty: 1

## 2017-05-01 MED FILL — WATER FOR INJECTION, STERILE INJECTION SOLUTION: INTRAMUSCULAR | Qty: 10

## 2017-05-01 MED FILL — CELEBREX 200 MG CAPSULE: 200 200 mg | ORAL | Qty: 2

## 2017-05-01 MED FILL — HEPARIN (PORCINE) 5,000 UNIT/ML INJECTION SOLUTION: 5000 5,000 unit/mL | INTRAMUSCULAR | Qty: 1

## 2017-05-01 NOTE — Unmapped (Signed)
Anesthesia Post Note    Patient: Amy Knight    Procedure(s) Performed: Procedure(s):  HYSTERECTOMY SALPINGO-OOPHORECTOMY LYMPH NODE DISSECTION ROBOTIC ASSISTED SENTINEL NODES    Anesthesia type: general    Patient location: PACU    Post pain: Adequate analgesia    Post assessment: no apparent anesthetic complications    Last Vitals:   Vitals:    05/01/17 1700 05/01/17 1715 05/01/17 1730 05/01/17 1745   BP: 132/65 124/73 142/74 133/69   BP Location:       Patient Position:       Pulse: 82 78 83 75   Resp: 16 14 12 21    Temp:       TempSrc:       SpO2: 94% 93% 93% 93%   Weight:       Height:            Post vital signs: stable    Level of consciousness: awake, alert  and oriented    Complications: None

## 2017-05-01 NOTE — Unmapped (Signed)
H&P Update  05/01/2017    Patient seen and examined by me. No updates to medical history. Allergies confirmed.    Vitals:    05/01/17 0950   BP: 147/74   Pulse: 85   Resp: 23   Temp: 98 ??F (36.7 ??C)   SpO2: 99%       Labs:   Lab Results   Component Value Date    WBC 7.6 04/26/2017    HGB 14.6 04/26/2017    HCT 43.0 04/26/2017    MCV 90.6 04/26/2017    PLT 253 04/26/2017        Lab Results   Component Value Date    CREATININE 0.62 04/26/2017    NA 138 04/26/2017    K 4.0 04/26/2017    CL 103 04/26/2017    CO2 25 04/26/2017         Consent previously obtained.  No changes to H&P above.  Urine pregnancy test: na due to age    Type and screen ordered.  Plan to proceed with scheduled surgery.        Maggie Font, MD  Gynecology Oncology  [phone]    H&P reviewed, patient examined, no changes to H&P.

## 2017-05-01 NOTE — Nursing Note (Signed)
Pt seen, pt very drowsy/sleepy, awakens to sternal rub. Family at bedside. abd sites clean dry, intact.

## 2017-05-01 NOTE — Op Note (Signed)
PREOPERATIVE DIAGNOSIS:  Endometrial cancer    POSTOPERATIVE DIAGNOSIS:  Same    PROCEDURE:  Robotic hysterectomy and bilateral salpingo-oophorectomy  Robotic bilateral sentinel pelvic lymph node dissection     ATTENDING SURGEON:  Montey Hora, MD    ASSISTANTS    Vale Haven, MD    ANESTHESIA:  General    ESTIMATED BLOOD LOSS:  50 mL    FLUIDS:  1100 mL.    URINE:  200 mL.    COMPLICATIONS:  None .    SPECIMENS   As per Brief Op Note.    FINDINGS   No significant abnormalities normal appearing uterus, tubes and ovaries, no evidence of extra-uterine disease. Normal appearing omentum. Smooth surfaces to peritoneal surfaces, liver, bowel. No pathologically enlarged nodes. Sentinel lymph nodes were located at external iliac artery and vein on the right and external iliac vein on the left.     INDICATIONS FOR PROCEDURE   The patient had postmenopausal bleeding and a uterine sampling demonstrating grade 1 endometrioid endometrial cancer.  She was counseled regarding options and she desired to proceed with hysterectomy, BSO and staging by a robotic approach. Questions were answered and consent was signed.     DESCRIPTION OF PROCEDURE   The patient was met in the patient receiving area on the day of surgery. The indications, risks, benefits and alternatives of the procedure were reviewed with the patient in detail and all of her questions were answered. The patient was then taken to the operating room where general anesthesia was obtained without difficulty. A time-out procedure for safety was performed in the room to confirm patient name, medical record number, and procedure being performed. The patient was given intravenous antibiotics prior to the procedure. Examination under anesthesia was performed. The patient was positioned in lithotomy in Bishop stirrups with her arms tucked to her side on a foampad. Her abdomen, perineum and vagina were prepped and draped in the usual fashion. The Foley catheter was  inserted under sterile conditions.  1 ml of dilute Indo-Cyanine dye was was injected at 3 and 9 o'clock in the cervical stroma as per the FIRES study protocol.  A RUMI II was used as a uterine manipulator and was placed with a pneumo-occluder.     A supraumbilical incision was then made with a scalpel. The peritoneal cavity was entered with the Veress needle. Correct placement of the Veress needle into the peritoneal cavity was confirmed by a drop in pressure. The abdomen was insufflated to a pressure of 15 mmHg. A 12-mm endoscope trocar was then placed through the incision with the findings as noted. The 8-mm trocars were placed in the left and right mid clavicular lines. A third 8 mm trocar was placed in the left lower quadrant. A 5-mm and a 12-mm endoscopic trocar was placed in the right upper quadrant. The small intestines were packed in the upper abdomen and the robot brought into proximity to the patient and docked without difficulty.     The round ligaments were transected and the retroperitoneum entered.  The ureters were identified.  The paravesical and pararectal spaces were opened and a sentinel lymph node dissection was performed. Sentinel lymph nodes were located at external iliac artery and vein on the right and external iliac vein on the left.    These specimens were placed in Endo-Catch bags and removed thru the assistant port.    Attention was then turned to the hysterectomy. The left colon was adherent to the ovarian ligament, and this  was divided sharply. The infundibulopelvic ligaments were skeletonized and cauterized with the bipolar and divided. The utero-ovarian ligaments were then dissected. The uterine blood vessels were then skeletonized. The uterovesical peritoneum was divided and the bladder dropped beneath the cervix. The uterine blood vessels were then cauterized with the bipolar and cut. The remaining cardinal ligament and uterosacral ligaments were then cauterized with either bipolar  or monopolar cautery, and cut. Throughout this procedure, the colon, bladder and ureters were shown to be out of the surgical field. Once beneath the cervix, the upper vagina was transected from the cervix and the specimen delivered vaginally. The vagina was closed using a series of interrupted figure of 8 0-PDS sutures, which were then trimmed at the vagina. The abdomen and pelvis were irrigated. Hemostasis was confirmed. A  fascial closure device was used to close the 12-mm port fascia using 0 vicryl. All the ports removed under direct visualization. After the robot was undocked and the ports removed, the skin was closed with 4-0 Monocryl and glue.     The incisions were noted to be hemostatic and a dressing was applied to the incision site. The patient was reversed from endotracheal anesthesia, and taken to the recovery room in stable condition. All sponge, lap and needle counts were correct X 2.     As attending surgeon, I was present for all key components of the procedure and readily available at all times.      Maggie Font, MD  Gynecology Oncology  [phone]

## 2017-05-01 NOTE — Unmapped (Signed)
Discharge Instructions and Information  Amy Knight  Robotic hysterectomy, bilateral salpingo-oophorectomy  Pelvic sentinel lymphadenectomy      Notify Your Doctor or Nurse if you have any of the following    ?? Wound Infection Symptoms    Call your doctor or nurse right away if you have signs of infection at you wound such as:   -More pain around the wound   -Change in the amount, color and odor of drainage   -The skin around the wound feels warm or has red streaks   -The wound separates or opens up   -You have a temperature greater than 100.8 F    ?? Deep Vein Thrombosis Symptoms   Call your doctor or nurse right away if you have any signs of blood clots such as  -Tender, swollen or reddened areas anywhere in your leg  -Numbness or tingling in your lower leg or calf, or at the top of your leg or groin  -Skin on you leg looks pale or blue or feels cold to touch  -Chest pain or have trouble breathing  -Fever or chills    ??  Fever or Chills   Call your doctor or nurse if you have a temperature greater than    100.8 degrees F that lasts more than 1 hour and/or chills    ??  Nausea and Vomiting   Call your doctor or nurse if you have nausea and vomiting that will not let you keep medicine down and will not let you keep fluids down    ?? Vaginal Discharge or Bleeding  ?? Call your doctor or nurse if foul smelling discharge form your vagina and heavy bleeding from your vagina that soaks 2 to 3 pads in 1 hour  ?? You CAN expect some spotting and a small amount of brown vaginal discharge for 1-2 weeks. Bright red bleeding that soaks a pad is NOT normal.    ?? Unrelieved Pain   Call your doctor or nurse if your pain gets worse or is not eased 1-2 hours after taking your pain medicine.     Urinary Tract Infection Symptoms   Call your doctor or nurse if you have signs of a urinary tract infection such as:   -Urine is cloudy, bloody or has a bad odor   -You leak urine or you have to urinate more often   -You feel burning when you  urinate   -You have a temperature greater than 100.13F    ?? Pain Med   A prescription for pain medicine will be sent home with you. Do not drive while taking prescription pain medicine. Eat when taking pain medicines to avoid nausea. Watch for constipation (use stool softeners if you are using the narcotic pain medications). Eat plenty of fruits, vegetables, juices, and drink 6 to 8 glasses of water each day.    If this medicine is too strong, or no longer needed, you may take    Ibuprofen, like Advil or Motrin, 200 mg (milligram) tablets- Take 3 tablets (600 mg) every 6 hours OR 4 tablets (800 mg) every 8 hours as needed for pain. Do not use this if you have stomach problems, stomach ulcers, or if you are on blood thinners, like   Coumadin.   Extra strength acetaminophen, like Tylenol Extra-strength -Take 2 tablets every 4 to 6 hours as needed for mild pain.     ?? Prevent Constipation     Post-op and Opioid Use Bowel Regimen Instructions:  Goal is 1 non-forced bowel movement every 1-2 days   You should be using stool softeners if you are narcotic pain medications.   Docusate sodium: 100mg  (brand name is Colace, generic is okay  to use). 1-2 tabs morning and bedtime (can increase to 3 tabs if needed). This keeps stool soft. This is over the counter.   Senna or Bisacodyl: (brand name Senakot, generic okay to use and Dulcolax). Start with 1 tablet at bedtime. Increase to 1-2 tablets both morning and bedtime if needed. This helps keep bowels moving. This is over the counter.   Milk of Magnesia: 2-4 Tablespoons (1/2 - 1 capful) as needed if no bowel movement in several days OR bowel movements hard to pass. If no bowel movement after 6 hours, repeat dose. If no bowel movement 6 hours after 2nd dose, call nurse. This is over the counter.     If you are unable to tolerate food or liquids, you have severe abdominal cramping, or your abdomen is getting bigger/fuller then call the nurse. Activity as tolerated, no driving  while taking narcotic pain medication, no heavy lifting.    ?? Incision Care  You do not need to cover your incisions.  Clean it each day with mild soap and water. Pat dry with a soft towel. Do not soak incisions in the tub. Do not apply ointments, medications, creams or lotions to the incisions.     Diet  ?? No restrictions-usual diet  -You are to resume your usual diet at home            Activity  ?? Do not do the following:  -Household chores such as vacuuming, or heavy cleaning  -Strenuous physical sports or exercise  -Lift, pull or move objects greater than a gallon of milk    ?? You may perform the following activities:  Increase your daily activities as you are able.  Rest when you feel tired.  You may go up and down stairs as you are able.    ?? Pelvic Rest  You should not resume sexual activity or place anything inside your vagina at this time and for at least 6 weeks after surgery.  Your doctor will discuss recommendations at your follow-up appointment        Follow-Up Appointments     Future Appointments  Date Time Provider Department Center   05/16/2017 10:30 AM Mikle Bosworth, MD [facility]     No follow-up provider specified.           Additional Contacts: [facility]    Important numbers:  Office Number: [phone]  Patient Care Fax: [phone]  [facility] Main Line: [phone]      If you need medical assistance after hours, please call the clinic line [phone]) and the answering service will take your call. If you are experiencing an emergency, please directly call 911.

## 2017-05-01 NOTE — Anesthesia Pre-Procedure Evaluation (Signed)
[facility]  DEPARTMENT OF ANESTHESIOLOGY  PRE-PROCEDURAL EVALUATION    Amy Knight is a 71 y.o. year old female presenting for:    Procedure(s):  HYSTERECTOMY SALPINGO-OOPHORECTOMY LYMPH NODE DISSECTION ROBOTIC ASSISTED SENTINEL NODES    Surgeon:   Mikle Bosworth, MD    Chief Complaint     Endometrial cancer determined by uterine biopsy (CMS Dx) [C54.1]    Review of Systems     Anesthesia Evaluation    Patient summary reviewed, nursing notes reviewed and CPC/PAT note reviewed.       No history of anesthetic complications   I have reviewed the History and Physical Exam, any relevant changes are noted in the anesthesia pre-operative evaluation.      Cardiovascular:    Exercise tolerance: good  Duke Met score: 4 - Raking leaves. Weeding or pushing a power mower.  (-) hypertension, past MI, CAD, cardiomyopathy, CABG/stent, dysrhythmias, angina, CHF.    Neuro/Muscoloskeletal/Psych:      (-) seizures, neuromuscular disease, TIA, CVA.     Pulmonary:      (-) COPD, asthma, sleep apnea.       GI/Hepatic/Renal:      (-) no hiatal hernia, GERD, PUD, liver disease, renal disease.    Endo/Other:    (+) hypothyroidism and cancer.      (-) diabetes mellitus, no bleeding disorder, no clotting disorder.       Past Medical History     Past Medical History:   Diagnosis Date    Endometrial cancer determined by uterine biopsy (CMS Dx) 03/2017    History of left breast cancer 2008    Treated with lumpectomy and radiation only per Dr. Allison Quarry in [place]    Hypercholesteremia     Hypothyroidism        Past Surgical History     Past Surgical History:   Procedure Laterality Date    APPENDECTOMY  1983    Abdominal incision    BREAST LUMPECTOMY  2008    Left breast pin head malignancy 2008    CERVICAL BIOPSY  03/2017    DILATION AND CURETTAGE OF UTERUS  03/2017    ENDOMETRIAL BIOPSY  03/2017    TUBAL LIGATION  1978       Family History     Family History   Problem Relation Age of Onset    Pancreatic Cancer Mother          Died at age 41    Heart disease Mother         congenital per family    Prostate Cancer Brother        Social History     Social History     Social History    Marital status: Married     Spouse name: N/A    Number of children: N/A    Years of education: N/A     Occupational History    Homemaker      Social History Main Topics    Smoking status: Never Smoker    Smokeless tobacco: Never Used    Alcohol use No    Drug use: No    Sexual activity: Not on file     Other Topics Concern    Caffeine Use Yes    Exercise No    Seat Belt Yes     Social History Narrative    No narrative on file       Medications     Allergies:  Allergies   Allergen Reactions  Peanut Hives and Itching       Home Meds:  Prior to Admission medications as of 05/01/17 0947   Medication Sig Taking?   aspirin 81 MG chewable tablet Chew 81 mg by mouth daily. Yes   levothyroxine (SYNTHROID, LEVOTHROID) 75 MCG tablet Take by mouth. Yes   simvastatin (ZOCOR) 40 MG tablet Take by mouth. Yes       Inpatient Meds:  Scheduled:    scopolamine  1 patch Transdermal Once     Continuous:     PRN: acetaminophen, ceFAZolin (ANCEF) IVPB **AND** metroNIDAZOLE, lidocaine (PF) 2% (20 mg/mL)    Vital Signs     Wt Readings from Last 3 Encounters:   05/01/17 154 lb (69.9 kg)   04/26/17 153 lb 1 oz (69.4 kg)   04/06/17 154 lb (69.9 kg)     Ht Readings from Last 3 Encounters:   05/01/17 5' 4 (1.626 m)   04/26/17 5' 4 (1.626 m)   04/06/17 5' 4.5 (1.638 m)     Temp Readings from Last 3 Encounters:   05/01/17 98 F (36.7 C) (Oral)   04/26/17 98.1 F (36.7 C)     BP Readings from Last 3 Encounters:   05/01/17 147/74   04/26/17 144/81   04/06/17 119/61     Pulse Readings from Last 3 Encounters:   05/01/17 85   04/26/17 84   04/06/17 81     @LASTSAO2 (3)@    Physical Exam     Airway:     Mallampati: II  Mouth Opening: >2 FB  TM distance: > = 3 FB  Neck ROM: full    Dental:   - No obvious cracked, loose, chipped, or missing teeth.     Pulmonary:         Cardiovascular:       Neuro/Musculoskeletal/Psych:      Abdominal:       Current OB Status:       Other Findings:        Laboratory Data     Lab Results   Component Value Date    WBC 7.6 04/26/2017    HGB 14.6 04/26/2017    HCT 43.0 04/26/2017    MCV 90.6 04/26/2017    PLT 253 04/26/2017       No results found for: [facility]    Lab Results   Component Value Date    GLUCOSE 163 (H) 04/26/2017    BUN 18 04/26/2017    CO2 25 04/26/2017    CREATININE 0.62 04/26/2017    K 4.0 04/26/2017    NA 138 04/26/2017    CL 103 04/26/2017    CALCIUM 9.8 04/26/2017    ALBUMIN 4.2 04/26/2017    PROT 7.1 04/26/2017    ALKPHOS 109 04/26/2017    ALT 22 04/26/2017    AST 15 04/26/2017    BILITOT 0.6 04/26/2017       No results found for: PTT, INR    No results found for: PREGTESTUR, PREGSERUM, HCG, HCGQUANT    Anesthesia Plan     ASA 2           Anesthesia Type:  general.         Intravenous induction.    Anesthetic plan and risks discussed with patient.    Plan, alternatives, and risks of anesthesia, including death, have been explained to and discussed with the patient/legal guardian.  By my assessment, the patient/legal guardian understands and agrees.  Scenario presented in detail.  Questions answered.  Use of blood products discussed with patient whom consented to blood products.   Plan discussed with CRNA.

## 2017-05-01 NOTE — Unmapped (Signed)
Pt remains intubated with T piece in place. VSS. Spoke with family and updated them on Pt condition. They stated Pt did not sleep last night. Will advise them of any changes in Pt condition.

## 2017-05-01 NOTE — OR Nursing (Signed)
Pt remains very sleepy. Arouses and able to follow commands. Informed Dr. Les Pou of same. Pt ok to transfer to floor. Report called to Rebbeca, RN and advised her pt did not sleep last night and remains sleepy.

## 2017-05-01 NOTE — Unmapped (Signed)
Problem: Pain  Goal: Patient's pain is progressing toward patient's stated pain goal  Assess and monitor patient's pain using appropriate pain scale. Collaborate with interdisciplinary team and initiate plan and interventions as ordered. Re-assess patient's pain level 30 - 60 minutes after pain management intervention.    Outcome: Progressing

## 2017-05-01 NOTE — OR Nursing (Signed)
Pt extubated. Dr. Ronnette Juniper notified of same. No respiratory complications noted. Place on 2 liters NC

## 2017-05-01 NOTE — TOC Discharge Planning (AHS/AVS) (Signed)
Anesthesia Transfer of Care Note    Patient: [name]  Procedure(s) Performed: Procedure(s):  HYSTERECTOMY SALPINGO-OOPHORECTOMY LYMPH NODE DISSECTION ROBOTIC ASSISTED SENTINEL NODES    Patient location: PACU    Anesthesia type: general    Airway Device on Arrival to PACU/ICU: Endotracheal Tube    IV Access: Peripheral    Monitors Recommended to be Used During PACU/ICU: Standard Monitors    Outstanding Issues to Address: pt remains intubated d/t pt not following commands.  Pt breathes spontaneously, unlabored via ETT with 10L of 100% O2 delivered via t-piece.  PACU accepts intubated pt and will extubate when appropriate.    Level of Consciousness: sedated    Post vital signs:    Vitals:    05/01/17 1522   BP: 121/52   Pulse: 78   Resp: 8   Temp: 97 F (36.1 C)   SpO2: 99%       Complications: None      Date 04/30/17 1500 - 05/01/17 0659(Not Admitted) 05/01/17 0700 - 05/02/17 0659   Shift 1500-2259 2300-0659 24 Hour Total 0700-1459 1500-2259 2300-0659 24 Hour Total   I  N  T  A  K  E   I.V.    1000  (14.3) 200  (2.9)  1200  (17.2)      Volume (mL) (lactated Ringers infusion)    1000 200  1200    IV Piggyback    100   100      Volume (mL) (ceFAZolin (ANCEF) 2 gram in NSS 100 mL IVPB)    100   100    Shift Total  (mL/kg)    1100  (15.7) 200  (2.9)  1300  (18.6)   O  U  T  P  U  T   Urine    200  (0.4)   200      Urine    200   200    Blood    50   50      Est Blood Loss    50   50    Shift Total  (mL/kg)    250  (3.6)   250  (3.6)   Weight (kg)    69.9 69.9 69.9 69.9

## 2017-05-01 NOTE — Progress Notes (Signed)
Gynecologic Oncology Daily Note    Subjective:   [name] is a 71 y.o. recovering from surgery    She is immediately postop      Objective:  Vitals:  Temp:  [98 F (36.7 C)] 98 F (36.7 C)  Heart Rate:  [85] 85  Resp:  [23] 23  BP: (147)/(74) 147/74  Vitals:    05/01/17 0950   BP: 147/74   Pulse: 85   Resp: 23   Temp: 98 F (36.7 C)   SpO2: 99%            Intake/Output last 3 shifts:  No intake/output data recorded.    Intake/Output this shift:  No intake/output data recorded.    Physical Exam:  Constitutional: sedated  Neurological: Grossly normal.  Respiratory: Clear to auscultation bilaterally.  Cardiovascular: Regular rate and rhythm  Abdomen:  Soft, non-tender, non-distended, positive bowel sounds. No rebound or guarding.  Port sites c/d/i   Extremities: No cyanosis or edema. SCDs  Skin: Skin color, texture, turgor normal.     Medications     scopolamine  1 patch Transdermal Once     acetaminophen, ceFAZolin (ANCEF) IVPB **AND** metroNIDAZOLE, fentaNYL **OR** fentaNYL **OR** fentaNYL **OR** fentaNYL **OR** fentaNYL, lidocaine (PF) 2% (20 mg/mL), morphine **OR** morphine **OR** morphine, naloxone, ondansetron, oxyCODONE **OR** oxyCODONE, proMETHazine    Labs:  Lab Results   Component Value Date    WBC 7.6 04/26/2017    HGB 14.6 04/26/2017    HCT 43.0 04/26/2017    MCV 90.6 04/26/2017    PLT 253 04/26/2017        Lab Results   Component Value Date    CREATININE 0.62 04/26/2017    NA 138 04/26/2017    K 4.0 04/26/2017    CL 103 04/26/2017    CO2 25 04/26/2017       Imaging:     Gynecology Oncology Postoperative Plan Note:     Amy Knight is a 71 y.o. who is POD#0  from a R TLH BSO SLND for emac G1 .    1. Post-Op plans   -IVF (type ns) @ 148ml/hr.    -Pain:      >Toradol: q 8hr, po/iv pain meds.   -Diet: ADAT   -Foley:FC out in am if UOP adequate   -Prophylaxis plan:SCDs, heparin       >30 days of lovenox is not needed.    -Strict I/Os   -EBL: 50cc. Pre op hgb 14.6. Post op hgb in am.     2.  Medical Problems-   HLD: on statin. Continue perioperatively.   Hypothyroidism: on levothyroxine. continue  (L) breast CA: s/p lumpectomy and radiation in 2008. No longer follows with oncology.     3. ONC: Final path pending    4. Lines/Tubes/Access: PIV X 2; FC;     -Ports: N/A     5. Code status: FULL    6. Disposition: Plan for home once medically appropriate. Anticipate 1 night hospital stay. Observation status.    Please contact me personally for any nursing questions, issues or concerns on my cell phone listed below. Please call the [facility] gyn onc attending on call [phone]) if you cannot reach me. Thank you      Maggie Font, MD  Gynecology Oncology  [phone]

## 2017-05-01 NOTE — Unmapped (Signed)
HYSTERECTOMY SALPINGO-OOPHORECTOMY LYMPH NODE DISSECTION ROBOTIC ASSISTED SENTINEL NODES  Procedure Note    Amy Knight  05/01/2017      Pre-op Diagnosis: Endometrial cancer determined by uterine biopsy (CMS Dx) [C54.1]       Post-op Diagnosis: same    Procedure(s):  HYSTERECTOMY SALPINGO-OOPHORECTOMY LYMPH NODE DISSECTION ROBOTIC ASSISTED SENTINEL NODES      Surgeon(s):  Mikle Bosworth, MD    Anesthesia: General Endotracheal    Staff:   Circulator: Tommy Medal, RN; Phineas Semen, RN; Steele Berg, RN  Relief Scrub: Renaldo Harrison, CSA  Scrub Person: Mendel Ryder, ST; Jannifer Hick  Assistant: Sherrine Maples, CSA  Resident: Marrian Salvage, MD    Estimated Blood Loss: less than 50 mL                 Specimens:   Specimens     ID Description Commments Type Source Tests Collected By Collected At    A left external iliac sentinel lymph nodes Needs ultra staging Lymph Node Lymph Node ?? SURGICAL PATHOLOGY EXAM   Mikle Bosworth, MD 05/01/17 1258    B Right external iliac vein sentinel lymph nodes Needs ultra staging Lymph Node Lymph Node ?? SURGICAL PATHOLOGY EXAM   Mikle Bosworth, MD 05/01/17 1312    C Right external iliac artery sentinel lymph nodes Needs ultra staging Lymph Node Lymph Node ?? SURGICAL PATHOLOGY EXAM   Mikle Bosworth, MD 05/01/17 1322    D uterus, cervix, bilateral fallopian tubes and ovaries  uterus, cervix, bilateral fallopian tubes and ovaries  Tissue Uterus ?? SURGICAL PATHOLOGY EXAM   Mikle Bosworth, MD 05/01/17 1408                 Drains:   NG/OG Tube Orogastric 18 Fr. Center mouth (Active)   Site Assessment Clean;Dry;Intact 05/01/2017 11:56 AM   Status Continuous Suction 05/01/2017 11:56 AM   Drainage Appearance Clear;Yellow 05/01/2017 11:56 AM   Number of days: 0       IUC (Foley) Straight-tip;Non-latex 16 Fr. (Active)   Number of days: 0         There were no complications unless listed below.        Salisha Bardsley CRAIG  Naevia Unterreiner     Date: 05/01/2017  Time: 2:32 PM

## 2017-05-01 NOTE — Progress Notes (Signed)
D: Pt admitted to 403 from PACU.  Pt drowsy, but awakens to verbal and tactile stimuli.      A: Oriented to room, bed, TV, and call light.  Reviewed diet order with patient.  Instructed to call for assistance.  Admission navigator completed.      R: Pt resting in bed.  Call light in reach.  Will continue to monitor.

## 2017-05-01 NOTE — Unmapped (Signed)
Ancef 2 grams available at bedside for anesthesia. Flagyl 500mg available at bedside for anesthesia.

## 2017-05-01 NOTE — Unmapped (Signed)
INTRA-OP POST BRIEFING NOTE: Amy Knight      Specimens:   Specimens     ID Source Type Tests Collected By Collected At Frozen? Attributes Order ID Breast Spec Formalin Marked as Sent    A Lymph Node Lymph Node ?? SURGICAL PATHOLOGY EXAM   Mikle Bosworth, MD 05/01/17 1258  Sent in Formalin ?? [id number]        Comment: Needs ultra staging    B Lymph Node Lymph Node ?? SURGICAL PATHOLOGY EXAM   Mikle Bosworth, MD 05/01/17 1312 No Sent in Formalin       Comment: Needs ultra staging    C Lymph Node Lymph Node ?? SURGICAL PATHOLOGY EXAM   Mikle Bosworth, MD 05/01/17 1322 No Sent in Formalin       Comment: Needs ultra staging    D Uterus Tissue ?? SURGICAL PATHOLOGY EXAM   Mikle Bosworth, MD 05/01/17 1408 No Sent in Formalin       Comment: uterus, cervix, bilateral fallopian tubes and ovaries           Prior to leaving the room: Nurse confirmed name of procedure, completion of instrument, sponge & needle counts, reads specimen labels aloud including patient name and addresses any equipment issues? Nurse confirmed wound class. Nurse to surgeon and anesthesia: What are key concerns for recovery and management of the patient?  Yes      Blood products stored at appropriate temperatures prior to return to blood bank (if applicable)? N/A      Patient identification band secured on patient prior to transfer out of the operating room? Yes      Other Comments: Specimens verified with Dr. Lenora Boys    Signed: Tommy Medal    Date: 05/01/2017    Time: 2:26 PM

## 2017-05-02 LAB — BASIC METABOLIC PANEL
Anion Gap: 8 mmol/L (ref 3–16)
BUN: 14 mg/dL (ref 7–25)
CO2: 23 mmol/L (ref 21–33)
Calcium: 8.3 mg/dL (ref 8.6–10.3)
Chloride: 109 mmol/L (ref 98–110)
Creatinine: 0.51 mg/dL (ref 0.60–1.30)
Glucose: 157 mg/dL (ref 70–100)
Osmolality, Calculated: 294 mOsm/kg (ref 278–305)
Potassium: 4.1 mmol/L (ref 3.5–5.3)
Sodium: 140 mmol/L (ref 133–146)
eGFR AA CKD-EPI: >90 See note.
eGFR NONAA CKD-EPI: >90 See note.

## 2017-05-02 LAB — CBC
Hematocrit: 37.5 % (ref 35.0–45.0)
Hemoglobin: 12.4 g/dL (ref 11.7–15.5)
MCH: 30.7 pg (ref 27.0–33.0)
MCHC: 33.1 g/dL (ref 32.0–36.0)
MCV: 92.7 fL (ref 80.0–100.0)
MPV: 7.8 fL (ref 7.5–11.5)
Platelets: 222 10*3/uL (ref 140–400)
RBC: 4.05 10*6/uL (ref 3.80–5.10)
RDW: 13.6 % (ref 11.0–15.0)
WBC: 10.5 10*3/uL (ref 3.8–10.8)

## 2017-05-02 LAB — MAGNESIUM: Magnesium: 1.9 mg/dL (ref 1.5–2.5)

## 2017-05-02 MED ORDER — ondansetron (ZOFRAN) 8 MG tablet
8 | ORAL_TABLET | Freq: Three times a day (TID) | ORAL | 0 refills | Status: AC | PRN
Start: 2017-05-02 — End: 2017-05-16

## 2017-05-02 MED ORDER — oxyCODONE-acetaminophen (PERCOCET) 5-325 mg per tablet
5-325 | ORAL_TABLET | Freq: Four times a day (QID) | ORAL | 0 refills | Status: AC | PRN
Start: 2017-05-02 — End: 2017-05-09

## 2017-05-02 MED ORDER — ibuprofen (ADVIL,MOTRIN) 800 MG tablet
800 | ORAL_TABLET | Freq: Three times a day (TID) | ORAL | 0 refills | Status: AC | PRN
Start: 2017-05-02 — End: ?

## 2017-05-02 MED FILL — LEVOTHYROXINE 75 MCG TABLET: 75 75 MCG | ORAL | Qty: 1

## 2017-05-02 MED FILL — SODIUM CHLORIDE 0.9 % INTRAVENOUS SOLUTION: 125.00 125.00 mL/hr | INTRAVENOUS | Qty: 1000

## 2017-05-02 MED FILL — HEPARIN (PORCINE) 5,000 UNIT/ML INJECTION SOLUTION: 5000 5,000 unit/mL | INTRAMUSCULAR | Qty: 1

## 2017-05-02 MED FILL — ONDANSETRON HCL (PF) 4 MG/2 ML INJECTION SOLUTION: 4 4 mg/2 mL | INTRAMUSCULAR | Qty: 4

## 2017-05-02 MED FILL — KETOROLAC 15 MG/ML INJECTION SOLUTION: 15 15 mg/mL | INTRAMUSCULAR | Qty: 1

## 2017-05-02 MED FILL — FAMOTIDINE (PF) 20 MG/2 ML INTRAVENOUS SOLUTION: 20 20 mg/2 mL | INTRAVENOUS | Qty: 2

## 2017-05-02 NOTE — Unmapped (Signed)
Written and verbal discharge instructions provided to pt. Pt did not wish to go over instructions with nurse. Pt in hurry to get home and stated she will read thru it when she gets home. Extra abd binder given to pt.

## 2017-05-02 NOTE — Unmapped (Signed)
No acute events overnight. VSS at this time. Pt remains on 2 liters of oxygen overnight. PT very drowsy overnight but easily aroused. Minimal PO intake. IVF. Adequate urine output. Pain well controlled. Incisions CDI. Will plan for foley removal at 0600 per order and ambulation if pt less drowsy. No other needs at this time. Call light is within reach. Bed alarm on. Will continue to monitor.  MICHELLE R HOLLON

## 2017-05-02 NOTE — Unmapped (Signed)
Attempted to remove foley catheter per orders. This nurse deflated the 10ml balloon, and then rechecked to make sure it was all the way deflated. Then when attempting to remove met great resistance. Engineer, mining. She came in and rechecked the balloon to make sure was deflated all the way, and also met great resistance when trying to remove. Will notify Dr.Billingsley this am on rounds. Foley catheter left in place.

## 2017-05-02 NOTE — Unmapped (Signed)
Problem: Pain  Goal: Patient's pain is progressing toward patient's stated pain goal  Assess and monitor patient's pain using appropriate pain scale. Collaborate with interdisciplinary team and initiate plan and interventions as ordered. Re-assess patient's pain level 30 - 60 minutes after pain management intervention.    Outcome: Progressing      Problem: Daily Care  Goal: Daily care needs are met  Assess and monitor ability to perform self care and identify potential discharge needs.   Outcome: Progressing      Problem: Discharge Barriers  Goal: Patient's discharge needs are met  Collaborate with interdisciplinary team and initiate plans and interventions as needed.    Outcome: Progressing      Problem: Inadequate Airway Clearance  Goal: Patient will achieve/maintain normal respiratory rate/effort  Outcome: Progressing

## 2017-05-02 NOTE — Progress Notes (Signed)
Gynecologic Oncology Daily Note    Subjective:   Amy Knight is a 71 y.o. recovering from surgery    She is postop  Mild nausea  Tolerating soda, crackers  Pain controlled  Not out of bed yet        Objective:  Vitals:  Temp:  [97 F (36.1 C)-98 F (36.7 C)] 97.6 F (36.4 C)  Heart Rate:  [66-101] 101  Resp:  [8-25] 14  BP: (118-147)/(52-95) 138/95  FiO2:  [56 %-100 %] 100 %  Vitals:    05/02/17 0243   BP: (!) 138/95   Pulse: 101   Resp: 14   Temp: 97.6 F (36.4 C)   SpO2: 92%         Date 05/01/17 0700 - 05/02/17 0659 05/02/17 0700 - 05/03/17 0659   Shift 0700-1459 1500-2259 2300-0659 24 Hour Total 0700-1459 1500-2259 2300-0659 24 Hour Total   I  N  T  A  K  E   P.O.  0 60 60          P.O.  0 60 60        I.V.  (mL/kg) 1000  (14.3) 210  (3) 825  (11.8) 2035  (29.1)          I.V.  10 825 835          Volume (mL) (lactated Ringers infusion) 1000 200  1200        IV Piggyback 100   100          Volume (mL) (ceFAZolin (ANCEF) 2 gram in NSS 100 mL IVPB) 100   100        Shift Total  (mL/kg) 1100  (15.7) 210  (3) 885  (12.7) 2195  (31.4)       O  U  T  P  U  T   Urine  (mL/kg/hr) 200  (0.4) 450  (0.8) 500  (0.9) 1150  (0.7)          Urine 200   200          Output (mL) ([REMOVED] IUC (Foley) Straight-tip;Non-latex 16 Fr.)  450 500 950        Blood 50   50          Est Blood Loss 50   50        Shift Total  (mL/kg) 250  (3.6) 450  (6.4) 500  (7.2) 1200  (17.2)       Weight (kg) 69.9 69.9 69.9 69.9 69.9 69.9 69.9 69.9       Intake/Output last 3 shifts:  I/O last 3 completed shifts:  In: 2195 [P.O.:60; I.V.:2035; IV Piggyback:100]  Out: 1200 [Urine:1150; Blood:50]    Intake/Output this shift:  No intake/output data recorded.    Physical Exam:  Constitutional:awake  Neurological: Grossly normal.  Respiratory: Clear to auscultation bilaterally.  Cardiovascular: Regular rate and rhythm  Abdomen:  Soft, approp tender, non-distended, positive bowel sounds. No rebound or guarding.  Port sites c/d/i   GU: FC removed -  mild resistance  Extremities: No cyanosis or edema. SCDs  Skin: Skin color, texture, turgor normal.     Medications   sodium chloride 0.9 % 125 mL/hr (05/02/17 0113)      docusate sodium  100 mg Oral BID    famotidine (PF)  20 mg Intravenous BID    heparin (porcine)  5,000 Units Subcutaneous 3 times per day    ketorolac  15 mg Intravenous  Q8H    levothyroxine  75 mcg Oral DAILY 0600    senna  1 tablet Oral Nightly (2100)    sodium chloride  10 mL Intravenous QS     ondansetron **OR** ondansetron, oxyCODONE-acetaminophen **OR** oxyCODONE-acetaminophen, simethicone    Labs:  Lab Results   Component Value Date    WBC 10.5 05/02/2017    HGB 12.4 05/02/2017    HCT 37.5 05/02/2017    MCV 92.7 05/02/2017    PLT 222 05/02/2017        Lab Results   Component Value Date    CREATININE 0.51 (L) 05/02/2017    NA 140 05/02/2017    K 4.1 05/02/2017    CL 109 05/02/2017    CO2 23 05/02/2017       Imaging:     Gynecology Oncology Postoperative Plan Note:     Amy Knight is a 71 y.o. who is POD#1  from a R TLH BSO SLND for emac G1 .    1. Post-Op plans   -IVF (type ns) @ 171ml/hr. DC now   -Pain:      >Toradol: q 8hr, po/iv pain meds.   -Diet: ADAT   -Foley:FC out    -Prophylaxis plan:SCDs, heparin       >30 days of lovenox is not needed.    -Strict I/Os   -EBL: 50cc. Pre op hgb 14.6. Post op hgb 12.4     2. Medical Problems-   HLD: on statin. Continue perioperatively.   Hypothyroidism: on levothyroxine. continue  (L) breast CA: s/p lumpectomy and radiation in 2008. No longer follows with oncology.     3. ONC: Final path pending    4. Lines/Tubes/Access: PIV X 2; FC;     -Ports: N/A     5. Code status: FULL    6. Disposition: Plan for home once medically appropriate. Anticipate 1 night hospital stay. Observation status. DC today if tolerating solids.     Please contact me personally for any nursing questions, issues or concerns on my cell phone listed below. Please call the [facility] gyn onc attending on call ([phone]) if you  cannot reach me. Thank you      Amy Font, MD  Gynecology Oncology  [phone]

## 2017-05-02 NOTE — Unmapped (Signed)
[place]  Inpatient Discharge Summary     Patient: Amy Knight  Age: 71 y.o.    MRN: [id number]   CSN: [id number]    Date of Admission: 05/01/2017  Date of Discharge: 05/02/2017  Attending Physician: Claretta Fraise Billingsl*   Primary Care Physician: Tenna Delaine, MD     Diagnoses Present on Admission     Past Medical History:   Diagnosis Date   ??? Endometrial cancer determined by uterine biopsy (CMS Dx) 03/2017   ??? History of left breast cancer 2008    Treated with lumpectomy and radiation only per Dr. Allison Quarry in [place]   ??? Hypercholesteremia    ??? Hypothyroidism         Discharge Diagnoses     Active Hospital Problems    Diagnosis Date Noted   ??? Endometrial cancer determined by uterine biopsy (CMS Dx) [C54.1] 04/03/2017   ??? Endometrial cancer (CMS Dx) [C54.1] 05/01/2017      Resolved Hospital Problems    Diagnosis Date Noted Date Resolved   No resolved problems to display.       Operations/Procedures Performed (include dates)     Surgeries:  Surgical/Procedural Cases on this Admission     Case IDs Date Procedure Surgeon Location Status    [id number] 05/01/17 HYSTERECTOMY SALPINGO-OOPHORECTOMY LYMPH NODE DISSECTION ROBOTIC ASSISTED SENTINEL NODES Mikle Bosworth, MD [facility] OR Comp          Lines and tubes:  Patient Lines/Drains/Airways Status    Active Line / PIV Line     Name:   Placement date:   Placement time:   Site:   Days:    Peripheral IV 05/01/17 Left Hand  05/01/17    1016    Hand    less than 1                Other Procedures / Pertinent Imaging:      Lab Review:  Lab Results   Component Value Date    WBC 10.5 05/02/2017    HGB 12.4 05/02/2017    HCT 37.5 05/02/2017    MCV 92.7 05/02/2017    PLT 222 05/02/2017        Lab Results   Component Value Date    CREATININE 0.51 (L) 05/02/2017    NA 140 05/02/2017    K 4.1 05/02/2017    CL 109 05/02/2017    CO2 23 05/02/2017         Consulting Services (include reason)         none    Allergies     Allergies   Allergen Reactions   ??? Peanut Hives and  Itching       Discharge Medications     Current Discharge Medication List      START taking these medications    Details   ibuprofen (ADVIL,MOTRIN) 800 MG tablet Take 1 tablet (800 mg total) by mouth every 8 hours as needed for Pain.  Qty: 30 tablet, Refills: 0    Associated Diagnoses: Endometrial cancer determined by uterine biopsy (CMS Dx)      ondansetron (ZOFRAN) 8 MG tablet Take 1 tablet (8 mg total) by mouth every 8 hours as needed for Nausea.  Qty: 20 tablet, Refills: 0    Associated Diagnoses: Endometrial cancer determined by uterine biopsy (CMS Dx)      oxyCODONE-acetaminophen (PERCOCET) 5-325 mg per tablet Take 1 tablet by mouth every 6 hours as needed for Pain for up to 7 days. Indications:  Pain  Qty: 30 tablet, Refills: 0    Associated Diagnoses: Endometrial cancer determined by uterine biopsy (CMS Dx)         CONTINUE these medications which have NOT CHANGED    Details   aspirin 81 MG chewable tablet Chew 81 mg by mouth daily.      levothyroxine (SYNTHROID, LEVOTHROID) 75 MCG tablet Take by mouth.      simvastatin (ZOCOR) 40 MG tablet Take by mouth.             Discharge Exam     See note from date of discharge    Reason for Admission     Amy Knight is a 71 y.o. female who presented for surgery as above.       Hospital Course     Active Hospital Problems    Diagnosis Date Noted   ??? Endometrial cancer determined by uterine biopsy (CMS Dx) [C54.1] 04/03/2017   ??? Endometrial cancer (CMS Dx) [C54.1] 05/01/2017      Resolved Hospital Problems    Diagnosis Date Noted Date Resolved   No resolved problems to display.     Her postoperative course was uncomplicated and she was able to be advanced to a regular diet and oral pain medications once bowel function returned.  She was ambulating and voiding without difficulty.  On the date of discharge she had remained afebrile, her vital signs were stable and her abdomen was soft and appropriately tender to palpation.  The wound was clean, dry and intact.       Discharge instructions were reviewed and further follow up is planned in the [facility].  Medications are per the medical reconciliation sheet.     She was discharged home in stable condition.           Condition on Discharge     1. Functional Status: normal   Describe limitations, if any: none    2. Mental Status: Alert/Oriented   Describe limitations, if any: none    3. Dietary Restrictions / Tube Feeding / TPN  Diet Orders          Diet regular starting at 09/10 1815        As listed above    4. Discharge specific orders:   DISCHARGE INSTRUCTIONS      Since you are continuing your recovery in the comfort of your home, here are some points to keep in mind during your recovery:    ?? Pain should be controlled by pain medication and should improve over time.   ?? Please follow your medication instructions carefully.   ?? A small amount of bleeding or drainage at the operative is normal, and to be expected.    ?? No driving while on pain medications.  ?? There are no specific diet limitations.   ?? If nausea is present, limit yourself to liquids or semi-soft foods.     Call your physician if:    ?? You experience excessive nausea and vomiting.   ?? You experience excessive pain, which is not controlled by prescribed medication.  ?? You experience a large amount of bleeding or drainage from the area of your surgery.   ?? If you have a fever over 100.8 degrees.    Additional Instructions:    ?? No Douches, tampons or intercourse for 6 weeks.  ?? You may have a bloody discharge at times. Please call if you are having excessive bleeding (changing a pad an hour over 2-3  hours).   ?? You may climb stairs with care and moderation.  ?? You may take showers, but no tub baths for 6 weeks.   ?? No heavy lifting (no more than 15 pounds) for 6 weeks.    See Dr. Lenora Boys  as scheduled in your preop packet      5. Core measures followed: (if this is a core measure patient)  Discharge Weight: 154 lb (69.9 kg)          Disposition      Home with assistance      Follow-Up Appointments     Future Appointments  Date Time Provider Department Center   05/16/2017 10:30 AM Mikle Bosworth, MD [facility]     No follow-up provider specified.    Signed:    Mikle Bosworth, MD  05/02/2017, 7:56 AM

## 2017-05-16 ENCOUNTER — Ambulatory Visit: Admit: 2017-05-16 | Discharge: 2017-05-16 | Payer: Medicare (Managed Care)

## 2017-05-16 DIAGNOSIS — Z483 Aftercare following surgery for neoplasm: Secondary | ICD-10-CM

## 2017-05-16 NOTE — Unmapped (Signed)
History of Present Illness  HPI  Amy Knight is a 71 y.o. female with   Chief Complaint   Patient presents with   ??? Post-op Evaluation     s/p hyst endometrial cancer     GYNECOLOGY ONCOLOGY VISIT     History of Present Illness  Amy Knight is a 71 year old woman referred by Dr. Winferd Humphrey for a new diagnosis of grade 1 endometrial adenocarcinoma.  On 05/01/2017 she underwent a robotic hysterectomy and bilateral salpingo-oophorectomy, bilateral sentinel pelvic lymph node dissection. Final path revealed a Stage 1A grade 1 EMAC. She had an uncomplicated postoperative course and was discharged on POD#1.        Amy Knight is doing well. Recovering slowly.    She reports soreness at the RUQ port. No significant abdominal/pelvic pain.   She denies any bloating or fullness.  She reports normal bowel/bladder habits.  She denies any GU concerns. Denies any dysuria, frequency, hematuria, flank pain or fevers.  She denies any vaginal bleeding or discharge.   She denies neuropathy.   She reports no leg swelling.   Her appetite is good, and reports no nausea or vomiting.     Patient Active Problem List    Diagnosis   ??? Endometrial cancer (CMS Dx)   ??? Endometrial cancer determined by uterine biopsy (CMS Dx)     Overview Note:     (last update: 05/16/2017)     Stage 1A grade 1 EMAC   Endometrial cancer, FIGO, grade 1  History of left breast cancer in 2008: treated with lumpectomy and radiation per Dr. Allison Quarry in [place]    RMD: Dr. Winferd Humphrey    2018:    Pap: ASC-H    02/21/2017:   TVUS: The uterus is anteverted and normal in size measuring 7.4 x 4.8 x 3.9 cm. No focal uterine mass is present. The endometrium is thickened at 1.21 cm which is abnormal for a postmenopausal woman. Neither the right nor left ovary could be visualized. There is no free fluid.    02/24/2017:   EMB: SMALL FRAGMENTS OF BENIGN ENDOCERVICAL GLANDULAR EPITHELIUM AND SQUAMOUs??EPITHELIUM. NO ENDOMETRIAL TISSUE IS PRESENT FOR  EVALUATION.    03/24/2017:   Colposcopy, EMB, D & C     Cervical bx, 9 o'clock: negative for dysplasia. ECC: Benign endocervical glandular epithelum     Office hysteroscopy: OSH endometrioid grade 1 EC     05/01/2017:   Robotic hysterectomy and bilateral salpingo-oophorectomy, bilateral sentinel pelvic lymph node dissection: Stage 1A grade 1 EMAC      Uterus: EMACG1/ DOI 9/35mm 47.3%/NO LVSI.      Neg OVs/Cx/FTs. Neg sentinel LNs (R 0/2, L0/2)     IHC: Loss MLH1/PMS2. Methylation detected    Disposition: Recommendation- surveillance.   Current disease status:   Genetics: Loss MLH1/PMS2. Methylation detected- no further work up  Survivorship plan: pending completion of treatment            Past Medical History  She  has a past medical history of Endometrial cancer determined by uterine biopsy (CMS Dx) (03/2017); History of left breast cancer (2008); Hypercholesteremia; and Hypothyroidism.  Past Medical History:   Diagnosis Date   ??? Endometrial cancer determined by uterine biopsy (CMS Dx) 03/2017   ??? History of left breast cancer 2008    Treated with lumpectomy and radiation only per Dr. Allison Quarry in [place]   ??? Hypercholesteremia    ??? Hypothyroidism        Past  Surgical History  Past Surgical History:   Procedure Laterality Date   ??? APPENDECTOMY  1983    Abdominal incision   ??? BREAST LUMPECTOMY  2008    Left breast pin head malignancy 2008   ??? CERVICAL BIOPSY  03/2017   ??? DILATION AND CURETTAGE OF UTERUS  03/2017   ??? ENDOMETRIAL BIOPSY  03/2017   ??? HYSTERECTOMY SALPINGO-OOPHORECTOMY LYMPH NODE DISSECTION ROBOTIC ASST N/A 05/01/2017    Procedure: HYSTERECTOMY SALPINGO-OOPHORECTOMY LYMPH NODE DISSECTION ROBOTIC ASSISTED SENTINEL NODES;  Surgeon: Mikle Bosworth, MD;  Location: [facility] OR;  Service: Gynecology;  Laterality: N/A;   ??? TUBAL LIGATION  1978       Family History  Family History   Problem Relation Age of Onset   ??? Pancreatic Cancer Mother         Died at age 88   ??? Heart disease Mother         congenital  per family   ??? Prostate Cancer Brother      Family cancer history for ovarian, uterine and colon cancer is negative other than above.     Social History  Social History     Social History   ??? Marital status: Married     Spouse name: N/A   ??? Number of children: N/A   ??? 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 Smoking status: Never Smoker   ??? Smokeless tobacco: Never Used   ??? Alcohol use No   ??? Drug use: No   ??? Sexual activity: Not Asked     Other Topics Concern   ??? Caffeine Use Yes   ??? Exercise No   ??? Seat Belt Yes     Social History Narrative   ??? None       Past OB/GYN History  G2P1  She reports menarche at age 63 and menopause at age 71.  She denies a history of STIs.  She denies a history of abnormal cervical cytology except for recent and reports her last cytologic examination in 2018 was ASC-H. She used OCPs for 7 years in her 14's and has not taken HRT.     Health maintenance:  Mammogram: Date 12/2016 Results normal  Colonoscopy: None but has had negative fecal occult.    Allergies  She is allergic to peanut.    BMI  Body mass index is 26.43 kg/m??.      The following portions of the patient's history were reviewed and updated as appropriate: allergies, current medications, past family history, past medical history, past social history, past surgical history and problem list.    Review of Systems   Constitutional: Positive for fatigue. Negative for activity change, appetite change, chills and fever.   HENT: Negative for mouth sores and sore throat.    Eyes: Negative for photophobia and visual disturbance.   Respiratory: Negative for cough, chest tightness and shortness of breath.    Cardiovascular: Negative for chest pain and leg swelling.   Gastrointestinal: Negative for abdominal distention, abdominal pain, bloating, constipation, diarrhea, nausea and vomiting.        Soreness at port site   Genitourinary: Negative for difficulty urinating, frequency,  pelvic pain, vaginal bleeding, vaginal discharge and vaginal pain.   Musculoskeletal: Negative for arthralgias and back pain.   Skin: Negative for color change and pallor.   Neurological: Negative for dizziness, weakness and numbness.   Hematological:  Negative for adenopathy.   Psychiatric/Behavioral: Negative for agitation, confusion and depression. The patient is nervous/anxious.        Vitals  Blood pressure 144/77, pulse 82, height 5' 4 (1.626 m), weight 154 lb (69.9 kg), SpO2 98 %.    Physical Exam   Constitutional: She is oriented to person, place, and time. She appears well-developed and well-nourished.   HENT:   Head: Normocephalic and atraumatic.   Eyes: Conjunctivae are normal.   Neck: Normal range of motion.   Cardiovascular: Normal rate, regular rhythm and normal heart sounds.    Pulmonary/Chest: Effort normal and breath sounds normal.   Abdominal: Soft. She exhibits no distension and no mass. There is no tenderness. There is no rebound and no guarding.   No masses or tenderness.  No hernia palpable.   No hepatosplenomegaly.   Port sites healing well     Genitourinary:   Genitourinary Comments: Normal external genitalia/vulva.  Normal urethral meatus, urethra, bladder, anus and perineum.  Normal vagina and healing vaginal cuff     Musculoskeletal: Normal range of motion.   Neurological: She is alert and oriented to person, place, and time.   Skin: Skin is warm and dry.   Psychiatric: She has a normal mood and affect. Her behavior is normal. Judgment and thought content normal.      Neurologic Exam     Mental Status   Oriented to person, place, and time.       Review of Lab Results  Lab Results   Component Value Date    WBC 10.5 05/02/2017    HGB 12.4 05/02/2017    HCT 37.5 05/02/2017    PLT 222 05/02/2017    CREATININE 0.51 (L) 05/02/2017    BUN 14 05/02/2017    PROT 7.1 04/26/2017    AST 15 04/26/2017    ALT 22 04/26/2017    BILITOT 0.6 04/26/2017    CALCIUM 8.3 (L) 05/02/2017    MG 1.9 05/02/2017          Imaging   No results found.    Investigations Reviewed:     2018:    Pap: ASC-H    03/24/2017:   Colposcopy, emb     Cervical bx, 9 o'clock: negative for dysplasia. ECC: Benign endocervical glandular epithelum     Office hysteroscopy: OSH endometrioid grade 1 EC     Cancer Staging:  Cancer Staging  No matching staging information was found for the patient.       Assessment & Plan  Amy Knight underwent a robotic hysterectomy, BSO with sentinel pelvic lymph node dissection on 05/01/17 and is recovering well from surgery. She still has some moderate discomfort from the right lateral port side, but has been able to slowly increase her activities. We discussed further recovery and avoiding lifting anything over 10 lbs for 6 weeks postoperatively.     Final pathology showed a grade 1 endometrioid adenocarcinoma with 47.3% myometrial invasion, no lymphovascular space invasion and negative lymph nodes. This makes her a FIGO stage IA. Her risk of recurrence is low and I have recommended observation.     Surveillance will follow as below.  RTC 6 mos   She knows to be aware for any signs or symptoms of recurrence such as vaginal bleeding, pain or gastrointestinal or urinary changes, increasing pain, or any other bothersome symptoms, and will call if any of these occur.      Updated: Gynecol Oncol. 2017 Jul;146(1):3-10. doi: [email or web address]. Epub 2017 Mar  31    Genetics: IHC: Loss MLH1/PMS2. Methylation detected- no further work up indicated                          I have spent 25 minutes face to face with this patient with greater than 50% of this time spent in counseling and coordination of care discussing the above    Medical Decision Making:  The following items were considered in medical decision making:  Discussion of patient care with other providers  Review / order clinical lab tests  Review / order radiology tests      Maggie Font, MD, [facility]  Assistant Professor, Obstetrics and  Gynecology  Division of Gynecologic Oncology  [phone]

## 2017-11-16 ENCOUNTER — Ambulatory Visit: Admit: 2017-11-16 | Discharge: 2017-11-16 | Payer: Medicare (Managed Care)

## 2017-11-16 DIAGNOSIS — C541 Malignant neoplasm of endometrium: Secondary | ICD-10-CM

## 2017-11-16 NOTE — Unmapped (Signed)
History of Present Illness  HPI  Amy Knight is a 72 y.o. female with   Chief Complaint   Patient presents with   ??? Surveillance     GYNECOLOGY ONCOLOGY VISIT     History of Present Illness  Amy Knight is a 72 year old woman referred by Dr. Winferd Humphrey for a new diagnosis of grade 1 endometrial adenocarcinoma.  On 05/01/2017 she underwent a robotic hysterectomy and bilateral salpingo-oophorectomy, bilateral sentinel pelvic lymph node dissection. Final path revealed a Stage 1A grade 1 EMAC. She had an uncomplicated postoperative course and was discharged on POD#1.   She is seen today for 6 month surveillance       Amy Knight is doing well.   She denies any abdominal/pelvic pain.   She denies any bloating or fullness.  She reports normal bowel/bladder habits.  She denies any GU concerns. Denies any dysuria, frequency, hematuria, flank pain or fevers.  She denies any vaginal bleeding or discharge.   She denies neuropathy.   She reports no leg swelling.   Her appetite is good, and reports no nausea or vomiting.     Patient Active Problem List    Diagnosis   ??? Endometrial cancer determined by uterine biopsy (CMS Dx)     Overview Note:     (last update: 05/16/2017)     Stage 1A grade 1 EMAC   Endometrial cancer, FIGO, grade 1  History of left breast cancer in 2008: treated with lumpectomy and radiation per Dr. Allison Quarry in [employer] [employer]    RMD: Dr. Winferd Humphrey    2018:    Pap: ASC-H    02/21/2017:   TVUS: The uterus is anteverted and normal in size measuring 7.4 x 4.8 x 3.9 cm. No focal uterine mass is present. The endometrium is thickened at 1.21 cm which is abnormal for a postmenopausal woman. Neither the right nor left ovary could be visualized. There is no free fluid.    02/24/2017:   EMB: SMALL FRAGMENTS OF BENIGN ENDOCERVICAL GLANDULAR EPITHELIUM AND SQUAMOUs??EPITHELIUM. NO ENDOMETRIAL TISSUE IS PRESENT FOR EVALUATION.    03/24/2017:   Colposcopy, EMB, D & C     Cervical bx, 9 o'clock: negative for dysplasia.  ECC: Benign endocervical glandular epithelum     Office hysteroscopy: OSH endometrioid grade 1 EC     05/01/2017:   Robotic hysterectomy and bilateral salpingo-oophorectomy, bilateral sentinel pelvic lymph node dissection: Stage 1A grade 1 EMAC      Uterus: EMACG1/ DOI 9/76mm 47.3%/NO LVSI.      Neg OVs/Cx/FTs. Neg sentinel LNs (R 0/2, L0/2)     IHC: Loss MLH1/PMS2. Methylation detected    Disposition: Recommendation- surveillance.   Current disease status:   Genetics: Loss MLH1/PMS2. Methylation detected- no further work up  Survivorship plan: pending completion of treatment            Past Medical History  She  has a past medical history of Endometrial cancer determined by uterine biopsy (CMS Dx) (03/2017); History of left breast cancer (2008); Hypercholesteremia; and Hypothyroidism.  Past Medical History:   Diagnosis Date   ??? Endometrial cancer determined by uterine biopsy (CMS Dx) 03/2017   ??? History of left breast cancer 2008    Treated with lumpectomy and radiation only per Dr. Allison Quarry in [place]   ??? Hypercholesteremia    ??? Hypothyroidism        Past Surgical History  Past Surgical History:   Procedure Laterality Date   ??? APPENDECTOMY  1983    Abdominal incision   ??? BREAST LUMPECTOMY  2008    Left breast pin head malignancy 2008   ??? CERVICAL BIOPSY  03/2017   ??? DILATION AND CURETTAGE OF UTERUS  03/2017   ??? ENDOMETRIAL BIOPSY  03/2017   ??? HYSTERECTOMY SALPINGO-OOPHORECTOMY LYMPH NODE DISSECTION ROBOTIC ASST N/A 05/01/2017    Procedure: HYSTERECTOMY SALPINGO-OOPHORECTOMY LYMPH NODE DISSECTION ROBOTIC ASSISTED SENTINEL NODES;  Surgeon: Mikle Bosworth, MD;  Location: [facility] OR;  Service: Gynecology;  Laterality: N/A;   ??? TUBAL LIGATION  1978       Family History  Family History   Problem Relation Age of Onset   ??? Pancreatic Cancer Mother         Died at age 67   ??? Heart disease Mother         congenital per family   ??? Prostate Cancer Brother      Family cancer history for ovarian, uterine and colon cancer  is negative other than above.     Social History  Social History     Social History   ??? Marital status: Married     Spouse name: N/A   ??? Number of children: N/A   ??? 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 Smoking status: Never Smoker   ??? Smokeless tobacco: Never Used   ??? Alcohol use No   ??? Drug use: No   ??? Sexual activity: Not Asked     Other Topics Concern   ??? Caffeine Use Yes   ??? Exercise No   ??? Seat Belt Yes     Social History Narrative   ??? None       Past OB/GYN History  G2P1  She reports menarche at age 75 and menopause at age 51.  She denies a history of STIs.  She denies a history of abnormal cervical cytology except for recent and reports her last cytologic examination in 2018 was ASC-H. She used OCPs for 7 years in her 49's and has not taken HRT.     Health maintenance:  Mammogram: Date 12/2016 Results normal  Colonoscopy: None but has had negative fecal occult.    Allergies  She is allergic to peanut.    BMI  Body mass index is 26.26 kg/m??.      The following portions of the patient's history were reviewed and updated as appropriate: allergies, current medications, past family history, past medical history, past social history, past surgical history and problem list.    Review of Systems   Constitutional: Negative for activity change, appetite change, chills, fatigue and fever.   HENT: Negative for mouth sores and sore throat.    Eyes: Negative for photophobia and visual disturbance.   Respiratory: Negative for cough, chest tightness and shortness of breath.    Cardiovascular: Negative for chest pain and leg swelling.   Gastrointestinal: Negative for abdominal distention, abdominal pain, bloating, constipation, diarrhea, nausea and vomiting.   Genitourinary: Negative for difficulty urinating, frequency, pelvic pain, vaginal bleeding, vaginal discharge and vaginal pain.   Musculoskeletal: Negative for arthralgias and back pain.   Skin: Negative for  color change and pallor.   Neurological: Negative for dizziness, weakness and numbness.   Hematological: Negative for adenopathy.   Psychiatric/Behavioral: Negative for agitation, confusion and depression. The patient is not nervous/anxious.        Vitals  Blood pressure 154/84,  pulse 97, temperature 97 ??F (36.1 ??C), temperature source Oral, height 5' 4.5 (1.638 m), weight 155 lb 6 oz (70.5 kg).    Physical Exam   Constitutional: She is oriented to person, place, and time. She appears well-developed and well-nourished.   HENT:   Head: Normocephalic and atraumatic.   Eyes: Conjunctivae are normal.   Neck: Normal range of motion.   Cardiovascular: Normal rate, regular rhythm and normal heart sounds.    Pulmonary/Chest: Effort normal and breath sounds normal.   Abdominal: Soft. She exhibits no distension and no mass. There is no tenderness. There is no rebound and no guarding.   No masses or tenderness.  No hernia palpable.   No hepatosplenomegaly.    Genitourinary:   Genitourinary Comments: Normal external genitalia/vulva.  Normal urethral meatus, urethra, bladder, anus and perineum.  Normal vagina and vaginal cuff.  Uterus, cervix and adnexa surgically absent.  Normal rectum.  There is no nodularity in the posterior culdesac or rectovaginal septum.     Musculoskeletal: Normal range of motion.   Neurological: She is alert and oriented to person, place, and time.   Skin: Skin is warm and dry.   Psychiatric: She has a normal mood and affect. Her behavior is normal. Judgment and thought content normal.      Neurologic Exam     Mental Status   Oriented to person, place, and time.       Review of Lab Results  Lab Results   Component Value Date    WBC 10.5 05/02/2017    HGB 12.4 05/02/2017    HCT 37.5 05/02/2017    PLT 222 05/02/2017    CREATININE 0.51 (L) 05/02/2017    BUN 14 05/02/2017    PROT 7.1 04/26/2017    AST 15 04/26/2017    ALT 22 04/26/2017    BILITOT 0.6 04/26/2017    CALCIUM 8.3 (L) 05/02/2017    MG 1.9  05/02/2017         Imaging   No results found.    Investigations Reviewed:     2018:    Pap: ASC-H    03/24/2017:   Colposcopy, emb     Cervical bx, 9 o'clock: negative for dysplasia. ECC: Benign endocervical glandular epithelum     Office hysteroscopy: OSH endometrioid grade 1 EC     Cancer Staging:  Cancer Staging  No matching staging information was found for the patient.       Assessment & Plan  Amy Knight has a history of stage IA grade 1 endometrial adenocarcinoma, and underwent a robotic hysterectomy, BSO with sentinel pelvic lymph node dissection on 05/01/17.  Final pathology showed a grade 1 endometrioid adenocarcinoma with 47.3% myometrial invasion, no lymphovascular space invasion and negative lymph nodes. This makes her a FIGO stage IA. Her risk of recurrence is low and I have recommended observation.     She is seen today for 6 month surveillance.  She is doing well overall and without evidence of recurrent disease on exam today. We will see her back in the office for surveillance in 6 months. We discussed her prognosis, expectations and followup.  She knows to be aware for any signs or symptoms of recurrence such as vaginal bleeding, pain or gastrointestinal or urinary changes, and will call if any of these occur.    Surveillance will follow as below.  RTC 6 mos   She knows to be aware for any signs or symptoms of recurrence such as vaginal bleeding,  pain or gastrointestinal or urinary changes, increasing pain, or any other bothersome symptoms, and will call if any of these occur.      Updated: Gynecol Oncol. 2017 Jul;146(1):3-10. doi: [email or web address]. Epub 2017 Mar 31    Genetics: IHC: Loss MLH1/PMS2. Methylation detected- no further work up indicated                        Complexity: Moderate    Hedda Slade, CNP  Division of Gynecologic Oncology  [phone]

## 2018-05-10 ENCOUNTER — Ambulatory Visit: Admit: 2018-05-10 | Discharge: 2018-05-10 | Payer: Medicare (Managed Care)

## 2018-05-10 DIAGNOSIS — Z08 Encounter for follow-up examination after completed treatment for malignant neoplasm: Secondary | ICD-10-CM

## 2018-05-10 NOTE — Unmapped (Signed)
History of Present Illness  HPI  Amy Knight is a 72 y.o. female with   Chief Complaint   Patient presents with   ??? Follow-up     6 month follow up, endometrial ca     GYNECOLOGY ONCOLOGY VISIT     History of Present Illness  Amy Knight is a 72 year old woman referred by Dr. Winferd Humphrey for a diagnosis of grade 1 endometrial adenocarcinoma.  On 05/01/2017 she underwent a robotic hysterectomy and bilateral salpingo-oophorectomy, bilateral sentinel pelvic lymph node dissection. Final path revealed a Stage 1A grade 1 EMAC. She had an uncomplicated postoperative course and was discharged on POD#1.   She is seen today for 6 month surveillance       Amy Knight is doing well.   She denies any abdominal/pelvic pain.   She denies any bloating or fullness.  She reports normal bowel/bladder habits.  She denies any GU concerns. Denies any dysuria, frequency, hematuria, flank pain or fevers.  She denies any vaginal bleeding or discharge.   She denies neuropathy.   She reports no leg swelling.   Her appetite is good, and reports no nausea or vomiting.     Patient Active Problem List    Diagnosis   ??? Endometrial cancer determined by uterine biopsy (CMS Dx)     Overview Note:     (last update: 05/16/2017)     Stage 1A grade 1 EMAC   Endometrial cancer, FIGO, grade 1  History of left breast cancer in 2008: treated with lumpectomy and radiation per Dr. Allison Quarry in [place]    RMD: Dr. Winferd Humphrey    2018:    Pap: ASC-H    02/21/2017:   TVUS: The uterus is anteverted and normal in size measuring 7.4 x 4.8 x 3.9 cm. No focal uterine mass is present. The endometrium is thickened at 1.21 cm which is abnormal for a postmenopausal woman. Neither the right nor left ovary could be visualized. There is no free fluid.    02/24/2017:   EMB: SMALL FRAGMENTS OF BENIGN ENDOCERVICAL GLANDULAR EPITHELIUM AND SQUAMOUs??EPITHELIUM. NO ENDOMETRIAL TISSUE IS PRESENT FOR EVALUATION.    03/24/2017:   Colposcopy, EMB, D & C     Cervical bx, 9  o'clock: negative for dysplasia. ECC: Benign endocervical glandular epithelum     Office hysteroscopy: OSH endometrioid grade 1 EC     05/01/2017:   Robotic hysterectomy and bilateral salpingo-oophorectomy, bilateral sentinel pelvic lymph node dissection: Stage 1A grade 1 EMAC      Uterus: EMACG1/ DOI 9/36mm 47.3%/NO LVSI.      Neg OVs/Cx/FTs. Neg sentinel LNs (R 0/2, L0/2)     IHC: Loss MLH1/PMS2. Methylation detected    Disposition: Recommendation- surveillance.   Current disease status:   Genetics: Loss MLH1/PMS2. Methylation detected- no further work up  Survivorship plan: pending completion of treatment            Past Medical History  She  has a past medical history of Endometrial cancer determined by uterine biopsy (CMS Dx) (03/2017); History of left breast cancer (2008); Hypercholesteremia; and Hypothyroidism.  Past Medical History:   Diagnosis Date   ??? Endometrial cancer determined by uterine biopsy (CMS Dx) 03/2017   ??? History of left breast cancer 2008    Treated with lumpectomy and radiation only per Dr. Allison Quarry in [place]   ??? Hypercholesteremia    ??? Hypothyroidism        Past Surgical History  Past Surgical History:  Procedure Laterality Date   ??? APPENDECTOMY  1983    Abdominal incision   ??? BREAST LUMPECTOMY  2008    Left breast pin head malignancy 2008   ??? CERVICAL BIOPSY  03/2017   ??? DILATION AND CURETTAGE OF UTERUS  03/2017   ??? ENDOMETRIAL BIOPSY  03/2017   ??? HYSTERECTOMY SALPINGO-OOPHORECTOMY LYMPH NODE DISSECTION ROBOTIC ASST N/A 05/01/2017    Procedure: HYSTERECTOMY SALPINGO-OOPHORECTOMY LYMPH NODE DISSECTION ROBOTIC ASSISTED SENTINEL NODES;  Surgeon: Amy Bosworth, MD;  Location: [facility] OR;  Service: Gynecology;  Laterality: N/A;   ??? TUBAL LIGATION  1978       Family History  Family History   Problem Relation Age of Onset   ??? Pancreatic Cancer Mother         Died at age 61   ??? Heart disease Mother         congenital per family   ??? Prostate Cancer Brother      Family cancer history for  ovarian, uterine and colon cancer is negative other than above.     Social History  Social History     Social History   ??? Marital status: Married     Spouse name: N/A   ??? Number of children: N/A   ??? 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 Smoking status: Never Smoker   ??? Smokeless tobacco: Never Used   ??? Alcohol use No   ??? Drug use: No   ??? Sexual activity: Not Asked     Other Topics Concern   ??? Caffeine Use Yes   ??? Exercise No   ??? Seat Belt Yes     Social History Narrative   ??? None       Past OB/GYN History  G2P1  She reports menarche at age 89 and menopause at age 50.  She denies a history of STIs.  She denies a history of abnormal cervical cytology except for recent and reports her last cytologic examination in 2018 was ASC-H. She used OCPs for 7 years in her 5's and has not taken HRT.     Health maintenance:  Mammogram: Date 12/2016 Results normal  Colonoscopy: None but has had negative fecal occult.    Allergies  She is allergic to peanut.    BMI  Body mass index is 25.01 kg/m??.      The following portions of the patient's history were reviewed and updated as appropriate: allergies, current medications, past family history, past medical history, past social history, past surgical history and problem list.    Review of Systems   Constitutional: Negative for activity change, appetite change, chills, fatigue and fever.   HENT: Negative for mouth sores and sore throat.    Eyes: Negative for photophobia and visual disturbance.   Respiratory: Negative for cough, chest tightness and shortness of breath.    Cardiovascular: Negative for chest pain and leg swelling.   Gastrointestinal: Negative for abdominal distention, abdominal pain, bloating, constipation, diarrhea, nausea and vomiting.   Genitourinary: Negative for difficulty urinating, frequency, pelvic pain, vaginal bleeding, vaginal discharge and vaginal pain.   Musculoskeletal: Negative for arthralgias and  back pain.   Skin: Negative for color change and pallor.   Neurological: Negative for dizziness, weakness and numbness.   Hematological: Negative for adenopathy.   Psychiatric/Behavioral: Negative for agitation, confusion and depression. The patient is not nervous/anxious.  Vitals  Blood pressure 150/69, pulse 79, height 5' 4.5 (1.638 m), weight 148 lb (67.1 kg), SpO2 99 %.    Physical Exam   Constitutional: She is oriented to person, place, and time. She appears well-developed and well-nourished.   HENT:   Head: Normocephalic and atraumatic.   Eyes: Conjunctivae are normal.   Neck: Normal range of motion.   Cardiovascular: Normal rate, regular rhythm and normal heart sounds.    Pulmonary/Chest: Effort normal and breath sounds normal.   Abdominal: Soft. She exhibits no distension and no mass. There is no tenderness. There is no rebound and no guarding.   No masses or tenderness.  No hernia palpable.   No hepatosplenomegaly.    Genitourinary:   Genitourinary Comments: Normal external genitalia/vulva.  Normal urethral meatus, urethra, bladder, anus and perineum.  Normal vagina and vaginal cuff.  Uterus, cervix and adnexa surgically absent.  Normal rectum.  There is no nodularity in the posterior culdesac or rectovaginal septum.     Musculoskeletal: Normal range of motion.   Neurological: She is alert and oriented to person, place, and time.   Skin: Skin is warm and dry.   Psychiatric: She has a normal mood and affect. Her behavior is normal. Judgment and thought content normal.      Neurologic Exam     Mental Status   Oriented to person, place, and time.       Review of Lab Results  Lab Results   Component Value Date    WBC 10.5 05/02/2017    HGB 12.4 05/02/2017    HCT 37.5 05/02/2017    PLT 222 05/02/2017    CREATININE 0.51 (L) 05/02/2017    BUN 14 05/02/2017    PROT 7.1 04/26/2017    AST 15 04/26/2017    ALT 22 04/26/2017    BILITOT 0.6 04/26/2017    CALCIUM 8.3 (L) 05/02/2017    MG 1.9 05/02/2017          Imaging   No results found.    Investigations Reviewed:     2018:    Pap: ASC-H    03/24/2017:   Colposcopy, emb     Cervical bx, 9 o'clock: negative for dysplasia. ECC: Benign endocervical glandular epithelum     Office hysteroscopy: OSH endometrioid grade 1 EC     Cancer Staging:  Cancer Staging  Endometrial cancer determined by uterine biopsy (CMS Dx)  Staging form: Corpus Uteri - Carcinoma And Carcinosarcoma, AJCC 8th Edition  - Pathologic: FIGO Stage IA - Signed by Angelita Ingles, CNP on 05/10/2018         Assessment & Plan  KYLINA VULTAGGIO has a history of stage IA grade 1 endometrial adenocarcinoma, and underwent a robotic hysterectomy, BSO with sentinel pelvic lymph node dissection on 05/01/17.  Final pathology showed a grade 1 endometrioid adenocarcinoma with 47.3% myometrial invasion, no lymphovascular space invasion and negative lymph nodes. This makes her a FIGO stage IA. Her risk of recurrence is low and I have recommended observation.     She is seen today for 6 month surveillance.  She is doing well overall and without evidence of recurrent disease on exam today. We will see her back in the office for surveillance in 6 months. We discussed her prognosis, expectations and followup.  She knows to be aware for any signs or symptoms of recurrence such as vaginal bleeding, pain or gastrointestinal or urinary changes, and will call if any of these occur.    Surveillance  will follow as below.  RTC 12 mos   She knows to be aware for any signs or symptoms of recurrence such as vaginal bleeding, pain or gastrointestinal or urinary changes, increasing pain, or any other bothersome symptoms, and will call if any of these occur.      Updated: Gynecol Oncol. 2017 Jul;146(1):3-10. doi: [email or web address]. Epub 2017 Mar 31    Genetics: IHC: Loss MLH1/PMS2. Methylation detected- no further work up indicated    Pain  She denies presence of pain.  Pain will be reassessed at next visit.                                 Hedda Slade, CNP  Division of Gynecologic Oncology  [phone]    I saw and personally examined the patient today with my CNP [place]. I discussed the findings and therapeutic plan with the patient. I repeated, reviewed and agree with the history of present illness, past medical histories, family history, social history, medication list, and allergies as listed. The review of systems is as noted above. My physical exam confirms the findings listed above. Review of labs, pathology reports, radiograph reports, and medical records confirm the findings noted above. I agree with the assessment and plan as noted above. I have edited the note where appropriate.     I have personally performed a face to face diagnostic evaluation on this patient, seen initially by the CNP. I have personally developed the care plan as outlined in the Assessment and Plan.      Complexity: mod     I spent a total of 25 minutes face-to-face of which >50% was spent in counseling and/or coordination of care, documentation and counseling.with patient and/or family.      Topics discussed include endo  Cancer prognosis and treatment options and surveillance.      Maggie Font, MD  Gynecology Oncology  [phone]      Medical Decision Making:  The following items were considered in medical decision making:  Review / order clinical lab tests  Review / order radiology tests  Review / order other diagnostic tests/interventions

## 2019-04-11 ENCOUNTER — Ambulatory Visit: Admit: 2019-04-11 | Discharge: 2019-04-11 | Payer: Medicare (Managed Care)

## 2019-04-11 DIAGNOSIS — Z08 Encounter for follow-up examination after completed treatment for malignant neoplasm: Secondary | ICD-10-CM

## 2019-04-11 NOTE — Unmapped (Signed)
History of Present Illness  HPI  Amy Knight is a 73 y.o. female with   Chief Complaint   Patient presents with   ??? Follow-up     annual     GYNECOLOGY ONCOLOGY VISIT     History of Present Illness  Amy Knight is a 73 year old woman referred by Dr. Winferd Humphrey for a diagnosis of grade 1 endometrial adenocarcinoma.  On 05/01/2017 she underwent a robotic hysterectomy and bilateral salpingo-oophorectomy, bilateral sentinel pelvic lymph node dissection. Final path revealed a Stage 1A grade 1 EMAC.     She is seen today for surveillance       Amy Knight is doing well.   She denies any abdominal/pelvic pain. She had some r sided pain after painting her living room, but has resolved. They are tired of being in quarantine  She denies any bloating or fullness.  She reports normal bowel/bladder habits.  She denies any GU concerns. Denies any dysuria, frequency, hematuria, flank pain or fevers.  She denies any vaginal bleeding or discharge.   She denies neuropathy.   She reports no leg swelling.   Her appetite is good, and reports no nausea or vomiting.     Patient Active Problem List    Diagnosis   ??? Endometrial cancer determined by uterine biopsy (CMS Dx)     Overview Note:     (last update: 05/16/2017)     Stage 1A grade 1 EMAC   Endometrial cancer, FIGO, grade 1  History of left breast cancer in 2008: treated with lumpectomy and radiation per Dr. Allison Quarry in [place]    RMD: Dr. Winferd Humphrey    2018:    Pap: ASC-H    02/21/2017:   TVUS: The uterus is anteverted and normal in size measuring 7.4 x 4.8 x 3.9 cm. No focal uterine mass is present. The endometrium is thickened at 1.21 cm which is abnormal for a postmenopausal woman. Neither the right nor left ovary could be visualized. There is no free fluid.    02/24/2017:   EMB: SMALL FRAGMENTS OF BENIGN ENDOCERVICAL GLANDULAR EPITHELIUM AND SQUAMOUs??EPITHELIUM. NO ENDOMETRIAL TISSUE IS PRESENT FOR EVALUATION.    03/24/2017:   Colposcopy, EMB, D & C     Cervical bx, 9  o'clock: negative for dysplasia. ECC: Benign endocervical glandular epithelum     Office hysteroscopy: OSH endometrioid grade 1 EC     05/01/2017:   Robotic hysterectomy and bilateral salpingo-oophorectomy, bilateral sentinel pelvic lymph node dissection: Stage 1A grade 1 EMAC      Uterus: EMACG1/ DOI 9/63mm 47.3%/NO LVSI.      Neg OVs/Cx/FTs. Neg sentinel LNs (R 0/2, L0/2)     IHC: Loss MLH1/PMS2. Methylation detected    Disposition: Recommendation- surveillance.   Current disease status:   Genetics: Loss MLH1/PMS2. Methylation detected- no further work up  Survivorship plan: pending completion of treatment            Past Medical History  She  has a past medical history of Endometrial cancer determined by uterine biopsy (CMS Dx) (03/2017), History of left breast cancer (2008), Hypercholesteremia, and Hypothyroidism.  Past Medical History:   Diagnosis Date   ??? Endometrial cancer determined by uterine biopsy (CMS Dx) 03/2017   ??? History of left breast cancer 2008    Treated with lumpectomy and radiation only per Dr. Allison Quarry in [place]   ??? Hypercholesteremia    ??? Hypothyroidism        Past Surgical History  Past Surgical History:   Procedure Laterality Date   ??? APPENDECTOMY  1983    Abdominal incision   ??? BREAST LUMPECTOMY  2008    Left breast pin head malignancy 2008   ??? CERVICAL BIOPSY  03/2017   ??? DILATION AND CURETTAGE OF UTERUS  03/2017   ??? ENDOMETRIAL BIOPSY  03/2017   ??? HYSTERECTOMY SALPINGO-OOPHORECTOMY LYMPH NODE DISSECTION ROBOTIC ASST N/A 05/01/2017    Procedure: HYSTERECTOMY SALPINGO-OOPHORECTOMY LYMPH NODE DISSECTION ROBOTIC ASSISTED SENTINEL NODES;  Surgeon: Mikle Bosworth, MD;  Location: [facility];  Service: Gynecology;  Laterality: N/A;   ??? TUBAL LIGATION  1978       Family History  Family History   Problem Relation Age of Onset   ??? Pancreatic Cancer Mother         Died at age 73   ??? Heart disease Mother         congenital per family   ??? Prostate Cancer Brother      Family cancer history for  ovarian, uterine and colon cancer is negative other than above.     Social History  Social History     Socioeconomic History   ??? Marital status: Married     Spouse name: None   ??? Number of children: None   ??? Years of education: None   ??? Highest education level: None   Occupational History   ??? Occupation: Futures trader   Social Needs   ??? Financial resource strain: None   ??? Food insecurity     Worry: None     Inability: None   ??? Transportation needs     Medical: None     Non-medical: None   Tobacco Use   ??? Smoking status: Never Smoker   ??? Smokeless tobacco: Never Used   Substance and Sexual Activity   ??? Alcohol use: No   ??? Drug use: No   ??? Sexual activity: None   Lifestyle   ??? Physical activity     Days per week: None     Minutes per session: None   ??? Stress: None   Relationships   ??? Social Wellsite geologist on phone: None     Gets together: None     Attends religious service: None     Active member of club or organization: None     Attends meetings of clubs or organizations: None     Relationship status: None   ??? Intimate partner violence     Fear of current or ex partner: None     Emotionally abused: None     Physically abused: None     Forced sexual activity: None   Other Topics Concern   ??? Caffeine Use Yes   ??? Occupational Exposure Not Asked   ??? Exercise No   ??? Seat Belt Yes   Social History Narrative   ??? None       Past OB/GYN History  G2P1  She reports menarche at age 50 and menopause at age 88.  She denies a history of STIs.  She denies a history of abnormal cervical cytology except for recent and reports her last cytologic examination in 2018 was ASC-H. She used OCPs for 7 years in her 48's and has not taken HRT.     Health maintenance:  Mammogram: Date 12/2016 Results normal  Colonoscopy: None but has had negative fecal occult.    Allergies  She is allergic to peanut.    BMI  Body  mass index is 27.88 kg/m??.      The following portions of the patient's history were reviewed and updated as appropriate:  allergies, current medications, past family history, past medical history, past social history, past surgical history and problem list.    Review of Systems   Constitutional: Negative for activity change, appetite change, chills, fatigue and fever.   HENT: Negative for mouth sores and sore throat.    Eyes: Negative for photophobia and visual disturbance.   Respiratory: Negative for cough, chest tightness and shortness of breath.    Cardiovascular: Negative for chest pain and leg swelling.   Gastrointestinal: Negative for abdominal distention, abdominal pain, bloating, constipation, diarrhea, nausea and vomiting.   Genitourinary: Negative for difficulty urinating, frequency, pelvic pain, vaginal bleeding, vaginal discharge and vaginal pain.   Musculoskeletal: Negative for arthralgias and back pain.   Skin: Negative for color change and pallor.   Neurological: Negative for dizziness, weakness and numbness.   Hematological: Negative for adenopathy.   Psychiatric/Behavioral: Negative for agitation, confusion and depression. The patient is not nervous/anxious.        Vitals  Blood pressure 130/67, pulse 80, height 5' 4.5 (1.638 m), weight 165 lb (74.8 kg), SpO2 98 %.    Physical Exam   Constitutional: She is oriented to person, place, and time. She appears well-developed and well-nourished.   HENT:   Head: Normocephalic and atraumatic.   Eyes: Conjunctivae are normal.   Neck: Normal range of motion.   Cardiovascular: Normal rate, regular rhythm and normal heart sounds.   Pulmonary/Chest: Effort normal and breath sounds normal.   Abdominal: Soft. She exhibits no distension and no mass. There is no abdominal tenderness. There is no rebound and no guarding.   No masses or tenderness.  No hernia palpable.   No hepatosplenomegaly.    Genitourinary:    Genitourinary Comments: Normal external genitalia/vulva.  Normal urethral meatus, urethra, bladder, anus and perineum.  Normal vagina and vaginal cuff.  Uterus, cervix and  adnexa surgically absent.  Normal rectum.  There is no nodularity in the posterior culdesac or rectovaginal septum.       Musculoskeletal: Normal range of motion.   Neurological: She is alert and oriented to person, place, and time.   Skin: Skin is warm and dry.   Psychiatric: She has a normal mood and affect. Her behavior is normal. Judgment and thought content normal.      Neurologic Exam     Mental Status   Oriented to person, place, and time.       Review of Lab Results  Lab Results   Component Value Date    WBC 10.5 05/02/2017    HGB 12.4 05/02/2017    HCT 37.5 05/02/2017    PLT 222 05/02/2017    CREATININE 0.51 (L) 05/02/2017    BUN 14 05/02/2017    PROT 7.1 04/26/2017    AST 15 04/26/2017    ALT 22 04/26/2017    BILITOT 0.6 04/26/2017    CALCIUM 8.3 (L) 05/02/2017    MG 1.9 05/02/2017         Imaging   No results found.    Investigations Reviewed:     2018:    Pap: ASC-H    03/24/2017:   Colposcopy, emb     Cervical bx, 9 o'clock: negative for dysplasia. ECC: Benign endocervical glandular epithelum     Office hysteroscopy: OSH endometrioid grade 1 EC     Cancer Staging:  Cancer Staging  Endometrial cancer  determined by uterine biopsy (CMS Dx)  Staging form: Corpus Uteri - Carcinoma And Carcinosarcoma, AJCC 8th Edition  - Pathologic: FIGO Stage IA - Signed by Angelita Ingles, CNP on 05/10/2018         Assessment & Plan  Amy Knight has a history of stage IA grade 1 endometrial adenocarcinoma, and underwent a robotic hysterectomy, BSO with sentinel pelvic lymph node dissection on 05/01/17.  Final pathology showed a grade 1 endometrioid adenocarcinoma with 47.3% myometrial invasion, no lymphovascular space invasion and negative lymph nodes. This makes her a FIGO stage IA. Her risk of recurrence is low and I have recommended observation.     She is seen today for 1 year surveillance.  She is doing well overall and without evidence of recurrent disease on exam today. We will see her back in the office for  surveillance in 1 year. We discussed her prognosis, expectations and followup.  She knows to be aware for any signs or symptoms of recurrence such as vaginal bleeding, pain or gastrointestinal or urinary changes, and will call if any of these occur.    Surveillance will follow as below.  RTC 12 mos   She knows to be aware for any signs or symptoms of recurrence such as vaginal bleeding, pain or gastrointestinal or urinary changes, increasing pain, or any other bothersome symptoms, and will call if any of these occur.      Updated: Gynecol Oncol. 2017 Jul;146(1):3-10. doi: [email or web address]. Epub 2017 Mar 31    Genetics: IHC: Loss MLH1/PMS2. Methylation detected- no further work up indicated. [facility] offered 04/11/19, she will think about it. She reports she did have BRCA testing in 2008 and was negative. Explained this panel is more extended and tests different genes.     Pain  Pain Score: Zero (04/11/2019  9:16 AM)    She denies presence of pain.  Pain will be reassessed at next visit.    Disease Status: No evidence of disease/remission                 Glenice Bow, CNP  Division of Gynecologic Oncology  [phone]    I saw and personally examined the patient today with my CNP [place]. I discussed the findings and therapeutic plan with the patient. I repeated, reviewed and agree with the history of present illness, past medical histories, family history, social history, medication list, and allergies as listed. The review of systems is as noted above. My physical exam confirms the findings listed above. Review of labs, pathology reports, radiograph reports, and medical records confirm the findings noted above. I agree with the assessment and plan as noted above. I have edited the note where appropriate.     I have personally performed a face to face diagnostic evaluation on this patient, seen initially by the CNP. I have personally developed the care plan as outlined in the Assessment and  Plan.      Complexity: mod     I spent a total of 25 minutes face-to-face of which >50% was spent in counseling and/or coordination of care, documentation and counseling.with patient and/or family.      Topics discussed include endoemtrial  Cancer prognosis and treatment options and surveillance .      Maggie Font, MD  Gynecology Oncology  [phone]      Medical Decision Making:  The following items were considered in medical decision making:  Review / order clinical lab tests  Review / order radiology tests  Review / order other diagnostic tests/interventions

## 2019-05-14 ENCOUNTER — Ambulatory Visit: Payer: Medicare (Managed Care)

## 2020-04-09 ENCOUNTER — Ambulatory Visit: Admit: 2020-04-09 | Payer: Medicare (Managed Care)

## 2020-04-09 DIAGNOSIS — Z08 Encounter for follow-up examination after completed treatment for malignant neoplasm: Secondary | ICD-10-CM

## 2020-04-09 NOTE — Unmapped (Signed)
History of Present Illness  HPI  Amy Knight is a 74 y.o. female with   Chief Complaint   Patient presents with   ??? Follow-up     endometrial cancer surveillance      GYNECOLOGY ONCOLOGY VISIT     History of Present Illness  Amy Knight is a 74 year old woman referred by Dr. Winferd Humphrey for a diagnosis of grade 1 endometrial adenocarcinoma.  On 05/01/2017 she underwent a robotic hysterectomy and bilateral salpingo-oophorectomy, bilateral sentinel pelvic lymph node dissection. Final path revealed a Stage 1A grade 1 EMAC.     She is seen today for surveillance       Amy Knight is doing well.   She denies any abdominal/pelvic pain.   She denies any bloating or fullness.  She reports normal bowel/bladder habits.  She denies any GU concerns. Denies any dysuria, frequency, hematuria, flank pain or fevers.  She denies any vaginal bleeding or discharge.   She denies neuropathy.   She reports no leg swelling.   Her appetite is good, and reports no nausea or vomiting.     Patient Active Problem List    Diagnosis    ??? Endometrial cancer determined by uterine biopsy (CMS Dx)      Overview Note:     (last update: 04/09/2020)     Stage 1A grade 1 EMAC   Endometrial cancer, FIGO, grade 1  History of left breast cancer in 2008: treated with lumpectomy and radiation per Dr. Allison Quarry in [place]    RMD: Dr. Winferd Humphrey    2018:    Pap: ASC-H    02/21/2017:   TVUS: The uterus is anteverted and normal in size measuring 7.4 x 4.8 x 3.9 cm. No focal uterine mass is present. The endometrium is thickened at 1.21 cm which is abnormal for a postmenopausal woman. Neither the right nor left ovary could be visualized. There is no free fluid.    02/24/2017:   EMB: SMALL FRAGMENTS OF BENIGN ENDOCERVICAL GLANDULAR EPITHELIUM AND SQUAMOUs??EPITHELIUM. NO ENDOMETRIAL TISSUE IS PRESENT FOR EVALUATION.    03/24/2017:   Colposcopy, EMB, D & C     Cervical bx, 9 o'clock: negative for dysplasia. ECC: Benign endocervical glandular  epithelum     Office hysteroscopy: OSH endometrioid grade 1 EC     05/01/2017:   Robotic hysterectomy and bilateral salpingo-oophorectomy, bilateral sentinel pelvic lymph node dissection: Stage 1A grade 1 EMAC      Uterus: EMACG1/ DOI 9/47mm 47.3%/NO LVSI.      Neg OVs/Cx/FTs. Neg sentinel LNs (R 0/2, L0/2)     IHC: Loss MLH1/PMS2. Methylation detected    Disposition: pt to go back to gyn per her preference (to avoid [address] [place])   Current disease status:   Genetics: Loss MLH1/PMS2. Methylation detected- no further work up  Survivorship plan: pending completion of treatment               Past Medical History  She  has a past medical history of Endometrial cancer determined by uterine biopsy (CMS Dx) (03/2017), History of left breast cancer (2008), Hypercholesteremia, and Hypothyroidism.  Past Medical History:   Diagnosis Date   ??? Endometrial cancer determined by uterine biopsy (CMS Dx) 03/2017   ??? History of left breast cancer 2008    Treated with lumpectomy and radiation only per Dr. Allison Quarry in [place]   ??? Hypercholesteremia    ??? Hypothyroidism        Past Surgical History  Past  Surgical History:   Procedure Laterality Date   ??? APPENDECTOMY  1983    Abdominal incision   ??? BREAST LUMPECTOMY  2008    Left breast pin head malignancy 2008   ??? CERVICAL BIOPSY  03/2017   ??? DILATION AND CURETTAGE OF UTERUS  03/2017   ??? ENDOMETRIAL BIOPSY  03/2017   ??? HYSTERECTOMY SALPINGO-OOPHORECTOMY LYMPH NODE DISSECTION ROBOTIC ASST N/A 05/01/2017    Procedure: HYSTERECTOMY SALPINGO-OOPHORECTOMY LYMPH NODE DISSECTION ROBOTIC ASSISTED SENTINEL NODES;  Surgeon: Mikle Bosworth, MD;  Location: [facility] OR;  Service: Gynecology;  Laterality: N/A;   ??? TUBAL LIGATION  1978       Family History  Family History   Problem Relation Age of Onset   ??? Pancreatic Cancer Mother         Died at age 95   ??? Heart disease Mother         congenital per family   ??? Prostate Cancer Brother      Family cancer history for ovarian, uterine and colon  cancer is negative other than above.     Social History  Social History     Socioeconomic History   ??? Marital status: Married     Spouse name: Not on file   ??? Number of children: Not on file   ??? Years of education: Not on file   ??? Highest education level: Not on file   Occupational History   ??? Occupation: Homemaker   Tobacco Use   ??? Smoking status: Never Smoker   ??? Smokeless tobacco: Never Used   Substance and Sexual Activity   ??? Alcohol use: No   ??? Drug use: No   ??? Sexual activity: Not on file   Other Topics Concern   ??? Caffeine Use Yes   ??? Occupational Exposure Not Asked   ??? Exercise No   ??? Seat Belt Yes   Social History Narrative   ??? Not on file     Social Determinants of Health     Financial Resource Strain:    ??? Difficulty of Paying Living Expenses:    Food Insecurity:    ??? Worried About Programme researcher, broadcasting/film/video in the Last Year:    ??? Barista in the Last Year:    Transportation Needs:    ??? Freight forwarder (Medical):    ??? Lack of Transportation (Non-Medical):    Physical Activity:    ??? Days of Exercise per Week:    ??? Minutes of Exercise per Session:    Stress:    ??? Feeling of Stress :    Social Connections:    ??? Frequency of Communication with Friends and Family:    ??? Frequency of Social Gatherings with Friends and Family:    ??? Attends Religious Services:    ??? Database administrator or Organizations:    ??? Attends Engineer, structural:    ??? Marital Status:    Intimate Programme researcher, broadcasting/film/video Violence:    ??? Fear of Current or Ex-Partner:    ??? Emotionally Abused:    ??? Physically Abused:    ??? Sexually Abused:        Past OB/GYN History  G2P1  She reports menarche at age 61 and menopause at age 48.  She denies a history of STIs.  She denies a history of abnormal cervical cytology except for recent and reports her last cytologic examination in 2018 was ASC-H. She used OCPs for 7 years in her  20's and has not taken HRT.       Allergies  She is allergic to peanut.    BMI  Body mass index is 28.39 kg/m??.      The  following portions of the patient's history were reviewed and updated as appropriate: allergies, current medications, past family history, past medical history, past social history, past surgical history and problem list.    ROS- See HPI    Vitals  Blood pressure 147/75, pulse 80, height 5' 4.5 (1.638 m), weight 168 lb (76.2 kg), SpO2 98 %.    Physical Exam   Constitutional: She is oriented to person, place, and time. She appears well-developed.   HENT:   Head: Normocephalic and atraumatic.   Eyes: Conjunctivae are normal.   Cardiovascular: Normal rate.   Pulmonary/Chest: Effort normal.   Abdominal: Soft. She exhibits no distension and no mass. There is no abdominal tenderness. There is no rebound and no guarding.   No masses or tenderness.  No hernia palpable.   No hepatosplenomegaly.    Genitourinary:    Genitourinary Comments: Normal external genitalia/vulva.  Normal urethral meatus, urethra, bladder, anus and perineum.  Normal vagina and vaginal cuff.  Uterus, cervix and adnexa surgically absent. Normal rectum.  There is no nodularity in the posterior culdesac or rectovaginal septum.       Musculoskeletal:         General: Normal range of motion.      Cervical back: Normal range of motion.   Neurological: She is alert and oriented to person, place, and time.   Skin: Skin is warm and dry.   Psychiatric: Her behavior is normal. Judgment and thought content normal.      Neurologic Exam     Mental Status   Oriented to person, place, and time.       Review of Lab Results  Lab Results   Component Value Date    WBC 10.5 05/02/2017    HGB 12.4 05/02/2017    HCT 37.5 05/02/2017    PLT 222 05/02/2017    CREATININE 0.51 (L) 05/02/2017    BUN 14 05/02/2017    PROT 7.1 04/26/2017    AST 15 04/26/2017    ALT 22 04/26/2017    BILITOT 0.6 04/26/2017    CALCIUM 8.3 (L) 05/02/2017    MG 1.9 05/02/2017         Imaging   No results found.    Investigations Reviewed:     2018:    Pap: ASC-H    03/24/2017:   Colposcopy,  emb     Cervical bx, 9 o'clock: negative for dysplasia. ECC: Benign endocervical glandular epithelum     Office hysteroscopy: OSH endometrioid grade 1 EC     Cancer Staging:  Cancer Staging  Endometrial cancer determined by uterine biopsy (CMS Dx)  Staging form: Corpus Uteri - Carcinoma And Carcinosarcoma, AJCC 8th Edition  - Pathologic: FIGO Stage IA - Signed by Angelita Ingles, CNP on 05/10/2018         Assessment & Plan  Amy Knight has a history of stage IA grade 1 endometrial adenocarcinoma, and underwent a robotic hysterectomy, BSO with sentinel pelvic lymph node dissection on 05/01/17.  Final pathology showed a grade 1 endometrioid adenocarcinoma with 47.3% myometrial invasion, no lymphovascular space invasion and negative lymph nodes. This makes her a FIGO stage IA. Her risk of recurrence is low and I  recommended observation.     She is seen today for 1  year surveillance.  She is doing well overall and without evidence of recurrent disease on exam today. We will see her back in the office for surveillance in 1 year. We discussed her prognosis, expectations and followup.  She knows to be aware for any signs or symptoms of recurrence such as vaginal bleeding, pain or gastrointestinal or urinary changes, and will call if any of these occur.    Surveillance will follow as below.  RTC 12 mos -she will continue future follow up with her gynecologist    She knows to be aware for any signs or symptoms of recurrence such as vaginal bleeding, pain or gastrointestinal or urinary changes, increasing pain, or any other bothersome symptoms, and will call if any of these occur.      Updated: Gynecol Oncol. 2017 Jul;146(1):3-10. doi: [email or web address]. Epub 2017 Mar 31    Genetics: IHC: Loss MLH1/PMS2. Methylation detected- no further work up indicated. [facility] offered 04/11/19, she will think about it. She reports she did have BRCA testing in 2008 and was negative. Explained this panel is more extended and  tests different genes.     Pain   Pain Score: Zero (04/09/2020 10:03 AM)    She denies presence of pain.  Pain will be reassessed at next visit.      Disease Status: No evidence of disease/remission                 Glenice Bow, CNP  Division of Gynecologic Oncology  [phone]    I saw and personally examined the patient today with my CNP [place]. I discussed the findings and therapeutic plan with the patient. I repeated, reviewed and agree with the history of present illness, past medical histories, family history, social history, medication list, and allergies as listed. The review of systems is as noted above. My physical exam confirms the findings listed above. Review of labs, pathology reports, radiograph reports, and medical records confirm the findings noted above. I agree with the assessment and plan as noted above. I have edited the note where appropriate.     I have personally performed a face to face diagnostic evaluation on this patient, seen initially by the CNP. I have personally developed the care plan as outlined in the Assessment and Plan.      Complexity: mod     I spent a total of 35 minutes was spent prior to, during and after the patient encounter in counseling and/or coordination of care, documentation and counseling with patient and/or family.      Topics discussed include endometrail  Cancer prognosis and treatment options and management of surveillance.      Maggie Font, MD, FACOG  Associate Professor, Obstetrics and Gynecology  Division of Gynecologic Oncology        Medical Decision Making:  The following items were considered in medical decision making:  Review / order clinical lab tests  Review / order radiology tests  Review / order other diagnostic tests/interventions

## 2022-08-02 IMAGING — MR MRI BRAIN W/O CONTRAST
6 series · 48 of 48 positions shown · IV contrast (Off)
Comparison: none

MRI OF THE BRAIN WITHOUT CONTRAST
CLINICAL HISTORY: Daily headaches, neck pain.
TECHNIQUE: Multisequential multiplanar imaging was performed of the brain, including diffusion-weighted imaging.

[Series 2: T1 · sagittal · 5.0mm · 0.90mm/px · 7 of 21 slices shown (1 of 2)]
[im 1/21]
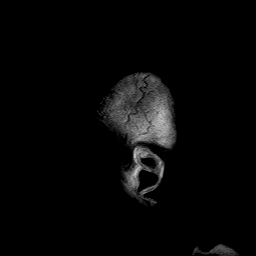
[im 4/21]
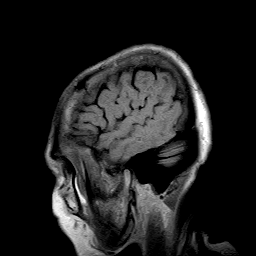
[im 7/21]
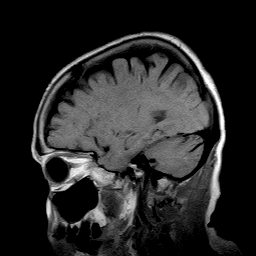
[im 11/21]
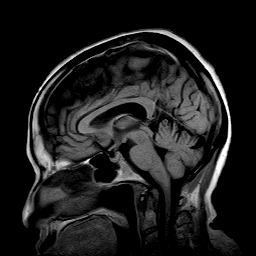
[im 14/21]
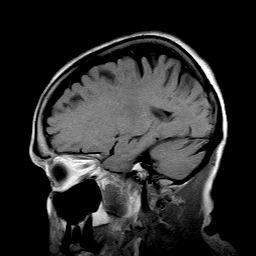
[im 17/21]
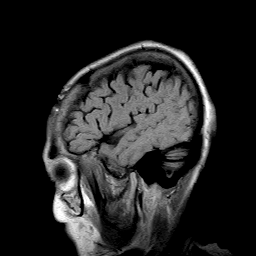
[im 21/21]
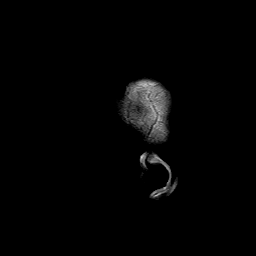

[Series 3: T2 · axial · 5.0mm · 0.94mm/px · z∈[-83,+51]mm · 9 of 22 slices shown]
[im 1/22]
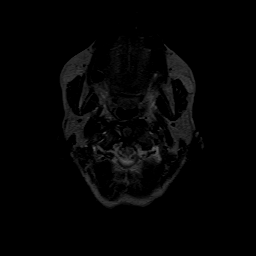
[im 3/22]
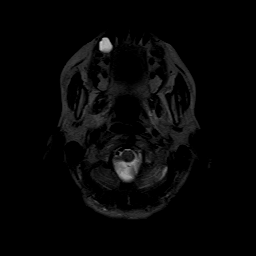
[im 6/22]
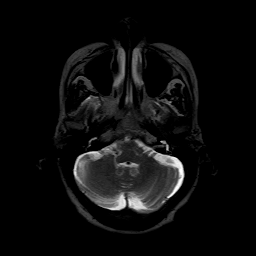
[im 8/22]
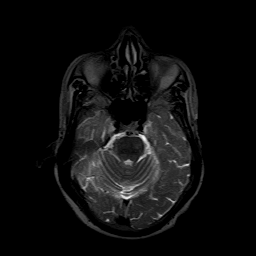
[im 11/22]
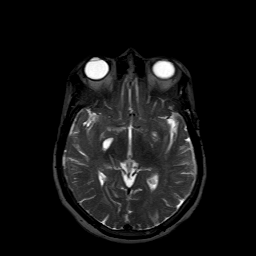
[im 14/22]
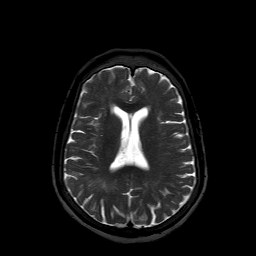
[im 16/22]
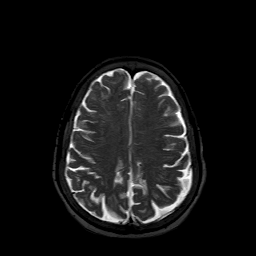
[im 19/22]
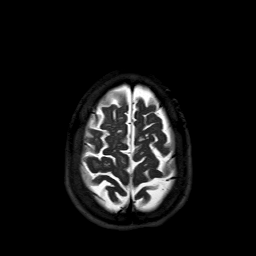
[im 22/22]
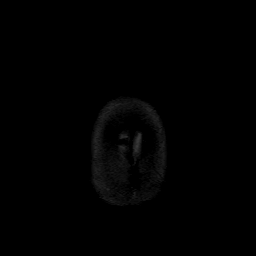

[Series 4: T1 · axial · 5.0mm · 0.94mm/px · z∈[-83,+51]mm · 9 of 22 slices shown (2 of 2)]
[im 1/22]
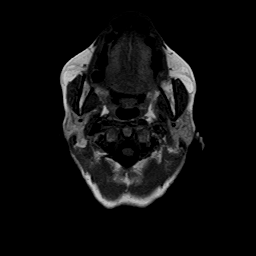
[im 3/22]
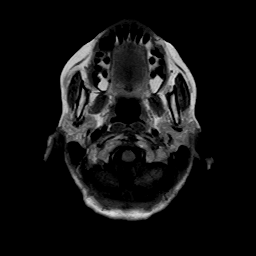
[im 6/22]
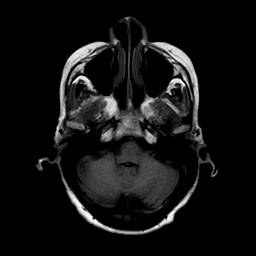
[im 8/22]
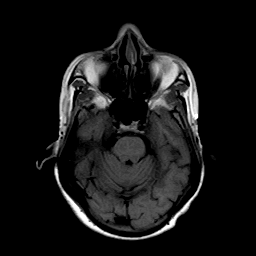
[im 11/22]
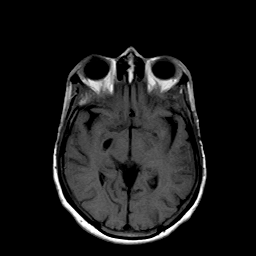
[im 14/22]
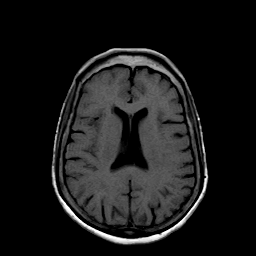
[im 16/22]
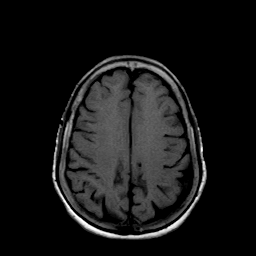
[im 19/22]
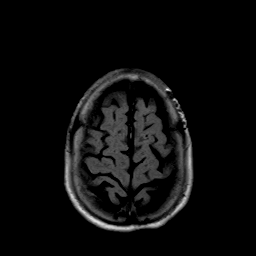
[im 22/22]
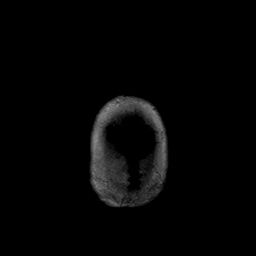

[Series 5: FLAIR · axial · 5.0mm · 0.94mm/px · z∈[-83,+51]mm · 9 of 22 slices shown]
[im 1/22]
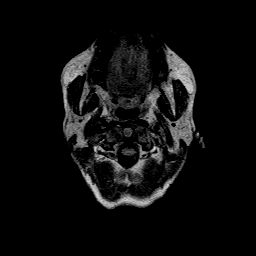
[im 3/22]
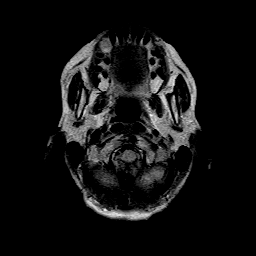
[im 6/22]
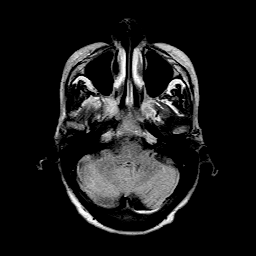
[im 8/22]
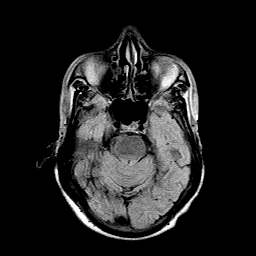
[im 11/22]
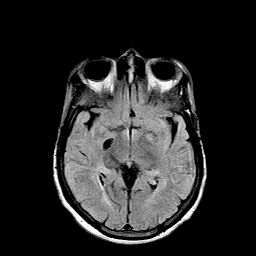
[im 14/22]
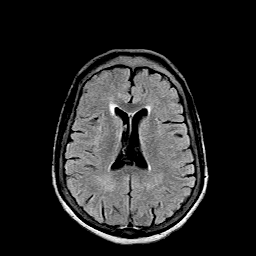
[im 16/22]
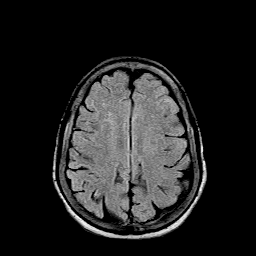
[im 19/22]
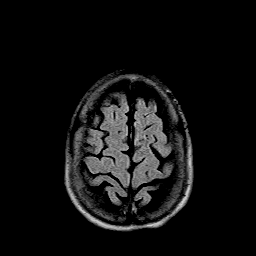
[im 22/22]
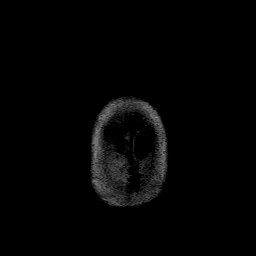

[Series 11: DWI · axial · 6.0mm · 1.88mm/px · z∈[-74,+49]mm · 7 of 18 slices shown]
[im 1/18]
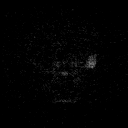
[im 3/18]
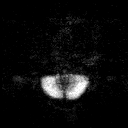
[im 6/18]
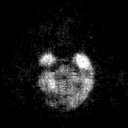
[im 9/18]
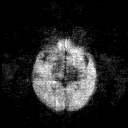
[im 12/18]
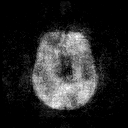
[im 15/18]
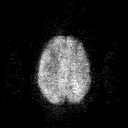
[im 18/18]
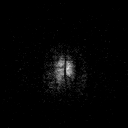

[Series 12: ADC · axial · 6.0mm · 1.88mm/px · z∈[-74,+49]mm · 7 of 18 slices shown]
[im 1/18]
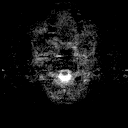
[im 3/18]
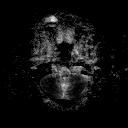
[im 6/18]
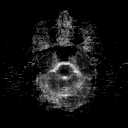
[im 9/18]
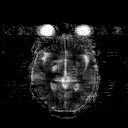
[im 12/18]
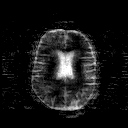
[im 15/18]
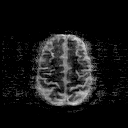
[im 18/18]
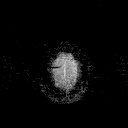

[48 of 48 positions shown; findings below may reference images not displayed]

FINDINGS: There is no MRI evidence of hemorrhage, mass effect, midline shift, extra-axial fluid collections, or hydrocephalus. There is some early involutional change as well as periventricular and deep white matter demyelination. There is no evidence of restricted diffusion. 

There appears to be some mild ethmoid inflammatory changes.
IMPRESSION: 1.
Involutional change with prominence of the ventricles and sulci as well as demyelination in the periventricular and deep white matter consistent with small vessel ischemic change and/or ischemic leukomalacia. 

2.
No evidence of acute or subacute ischemia or other cause of restricted diffusion. 

3.
Some mild ethmoid inflammatory changes.

## 2022-08-02 IMAGING — MR MRI CERVICAL SPINE WITHOUT CONTRAST
5 series · 48 of 48 positions shown · IV contrast (Off)
Comparison: none

MRI OF THE CERVICAL SPINE WITHOUT CONTRAST
CLINICAL HISTORY: Chronic neck pain with daily headaches.
TECHNIQUE: Multisequential multiplanar imaging was performed of the cervical spinal region.

[Series 1: z scano sag/cor · coronal · 6.0mm · 1.02mm/px · 3 of 6 slices shown]
[im 1/6]
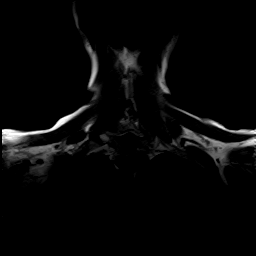
[im 3/6]
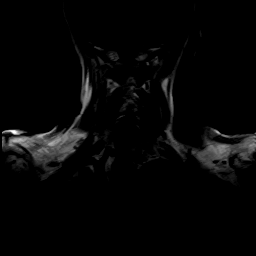
[im 6/6]
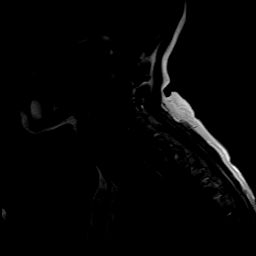

[Series 2: T2 · sagittal · 3.0mm · 0.94mm/px · 9 of 13 slices shown]
[im 1/13]
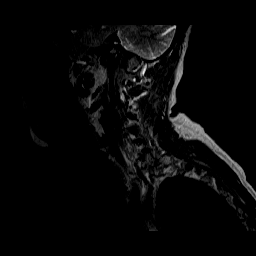
[im 2/13]
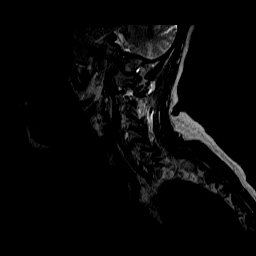
[im 4/13]
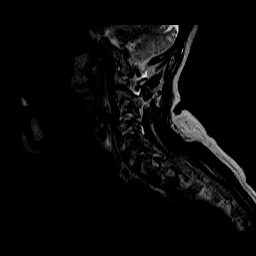
[im 5/13]
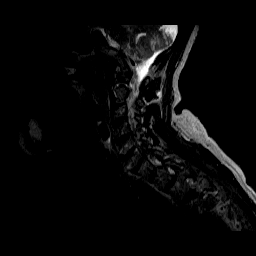
[im 7/13]
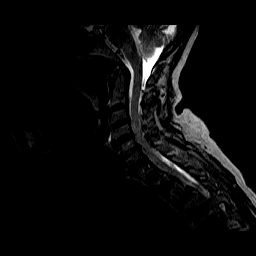
[im 8/13]
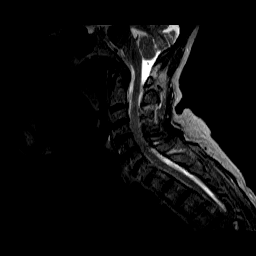
[im 10/13]
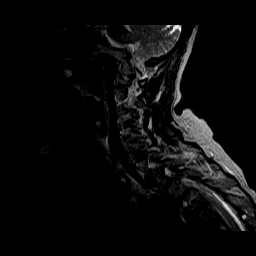
[im 11/13]
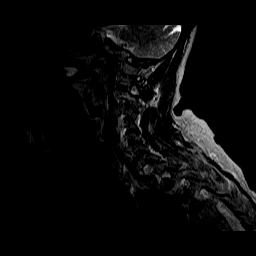
[im 13/13]
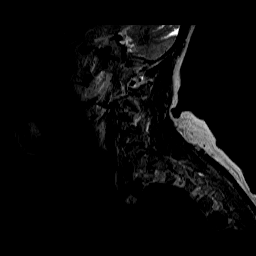

[Series 3: T1 · sagittal · 3.0mm · 0.94mm/px · 9 of 13 slices shown]
[im 1/13]
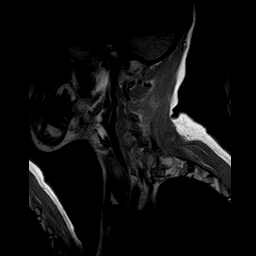
[im 2/13]
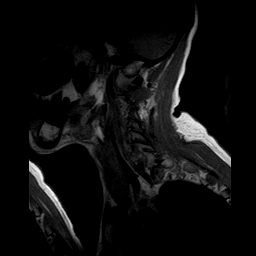
[im 4/13]
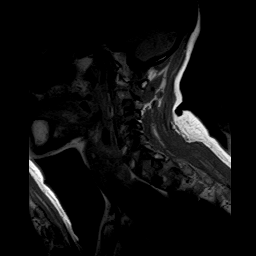
[im 5/13]
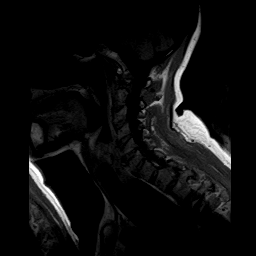
[im 7/13]
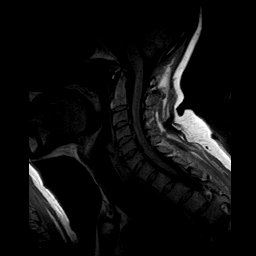
[im 8/13]
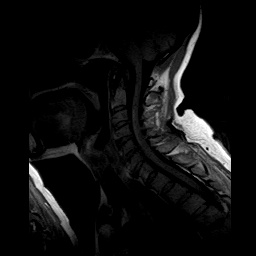
[im 10/13]
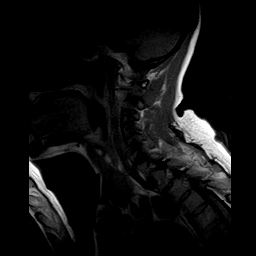
[im 11/13]
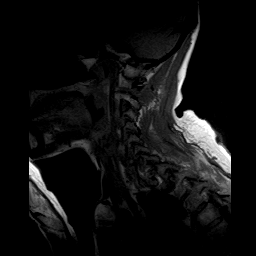
[im 13/13]
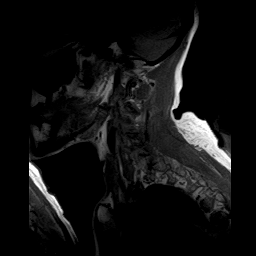

[Series 5: gr (person_name)/ mtc · axial · 3.0mm · 0.94mm/px · z∈[-76,+19]mm · 18 of 26 slices shown]
[im 1/26]
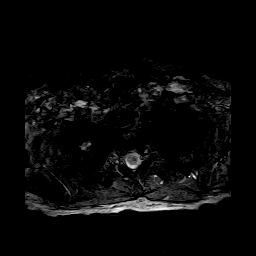
[im 2/26]
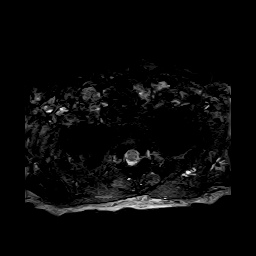
[im 3/26]
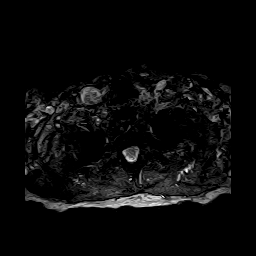
[im 5/26]
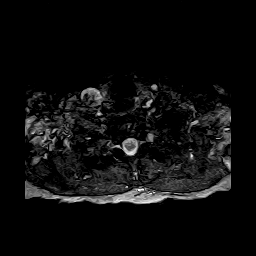
[im 6/26]
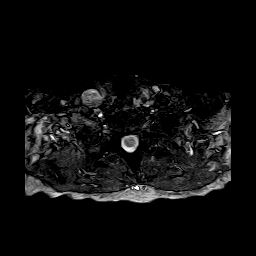
[im 8/26]
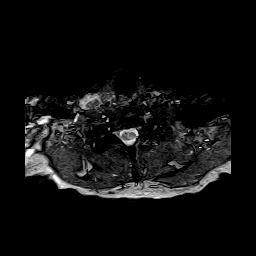
[im 9/26]
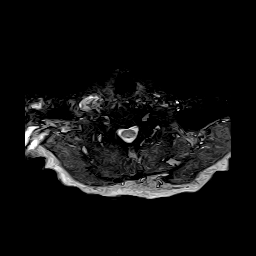
[im 11/26]
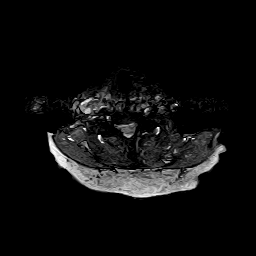
[im 12/26]
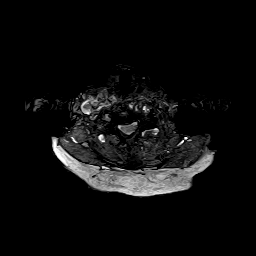
[im 14/26]
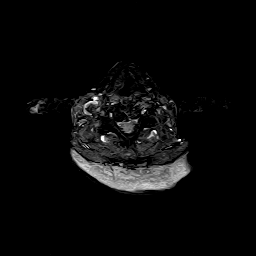
[im 15/26]
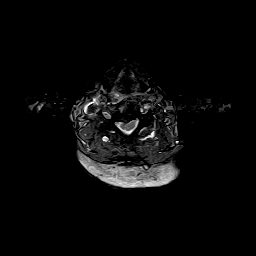
[im 17/26]
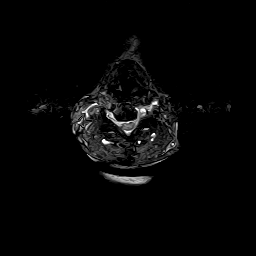
[im 18/26]
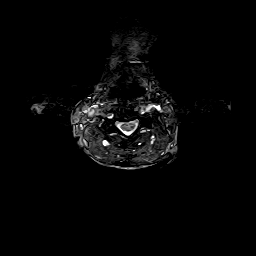
[im 20/26]
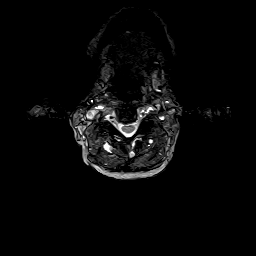
[im 21/26]
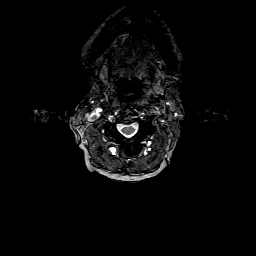
[im 23/26]
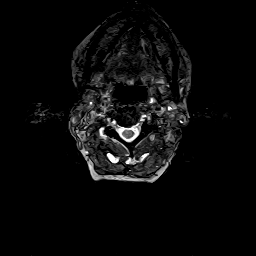
[im 24/26]
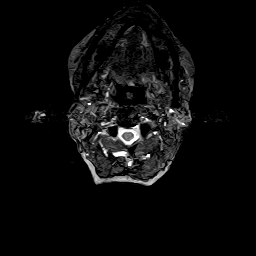
[im 26/26]
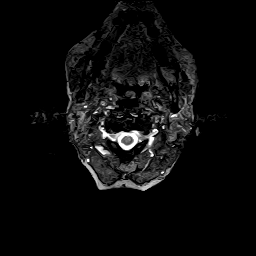

[Series 6: STIR · sagittal · 5.0mm · 0.94mm/px · 9 of 13 slices shown]
[im 1/13]
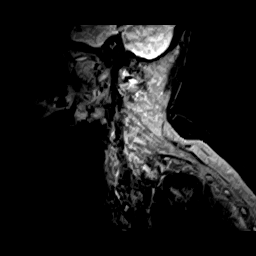
[im 2/13]
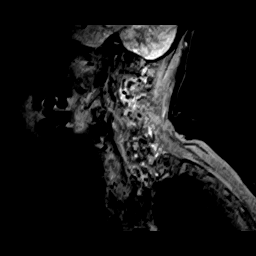
[im 4/13]
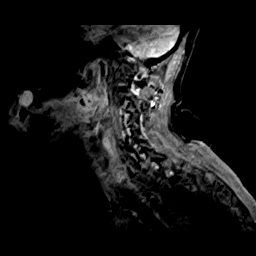
[im 5/13]
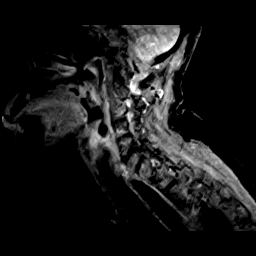
[im 7/13]
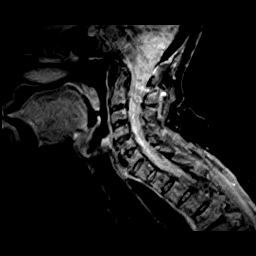
[im 8/13]
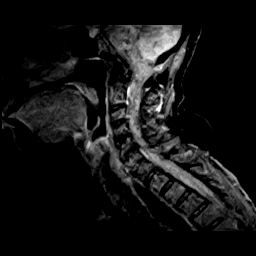
[im 10/13]
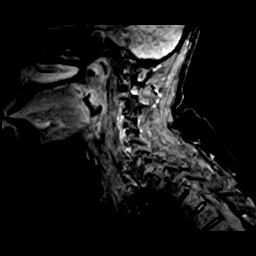
[im 11/13]
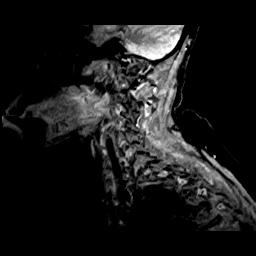
[im 13/13]
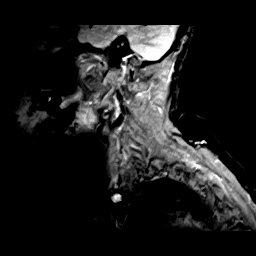

[48 of 48 positions shown; findings below may reference images not displayed]

FINDINGS: Normal marrow signal is noted throughout the cervical vertebral bodies. There is no abnormal signal identified of the cervical spinal cord. There is slight straightening of the cervical lordotic curvature. 

At C2-C3 level, there is 1 to 1.5 mm bulging. 

At C3-C4 level, there is 2 mm central and right paracentral herniation with early cord effacement. Mild left neural foraminal narrowing. 

At C4-C5 level, there is 2.5 to 3 mm central and right paracentral herniation with early cord effacement. Mild to moderate bilateral neural foraminal narrowing. 

At C5-C6 level, there is anterior, posterior, and lateral osteophyte and 3.5 mm broad-based herniation with early cord effacement. Severe bilateral neural foraminal narrowing. 

At C6-C7 level, there is anterior, posterior, and lateral osteophyte and 2 mm herniation. All findings asymmetric toward the right side. Severe bilateral neural foraminal narrowing. 

At C7-T1 level, there is no evidence of disc herniation, neural foraminal narrowing, or spinal stenosis.
IMPRESSION: 1.
1 to 1.5 mm bulging at C2-C3. 2 mm central and right paracentral herniation at C3-C4, 2.5 to 3 mm central and right paracentral herniation at C4-C5, and anterior, posterior, and lateral osteophyte and 3.5 mm broad-based herniation at C5-C6 with early cord effacement at C3-C4, C4-C5, and C5-C6. Anterior, posterior, and lateral osteophyte and 2 mm herniation with all findings asymmetric toward the right side at C6-C7. 

2.
Lateral osteophyte and uncovertebral hypertrophy are causing mild left neural foraminal narrowing at C3-C4, mild to moderate bilateral neural foraminal narrowing at C4-C5, and severe bilateral neural foraminal narrowing at C5-C6 and C6-C7 levels. 

3.
Slight straightening of the cervical lordotic curvature may be secondary to positioning vs. muscle spasm.
